# Patient Record
Sex: Female | Born: 1955 | Race: White | Hispanic: No | Marital: Married | Smoking: Never smoker
Health system: Southern US, Community
[De-identification: ages and names within clinical notes are randomized; demographics above are authoritative.]

## PROBLEM LIST (undated history)

## (undated) DIAGNOSIS — G43909 Migraine, unspecified, not intractable, without status migrainosus: Secondary | ICD-10-CM

## (undated) DIAGNOSIS — E785 Hyperlipidemia, unspecified: Secondary | ICD-10-CM

## (undated) DIAGNOSIS — M810 Age-related osteoporosis without current pathological fracture: Secondary | ICD-10-CM

## (undated) DIAGNOSIS — R011 Cardiac murmur, unspecified: Secondary | ICD-10-CM

## (undated) DIAGNOSIS — M1990 Unspecified osteoarthritis, unspecified site: Secondary | ICD-10-CM

## (undated) HISTORY — DX: Migraine, unspecified, not intractable, without status migrainosus: G43.909

## (undated) HISTORY — DX: Hyperlipidemia, unspecified: E78.5

## (undated) HISTORY — PX: COLONOSCOPY: SHX174

## (undated) HISTORY — DX: Age-related osteoporosis without current pathological fracture: M81.0

## (undated) HISTORY — DX: Cardiac murmur, unspecified: R01.1

---

## 1991-11-29 DIAGNOSIS — R011 Cardiac murmur, unspecified: Secondary | ICD-10-CM

## 1991-11-29 HISTORY — DX: Cardiac murmur, unspecified: R01.1

## 2001-09-05 ENCOUNTER — Encounter: Payer: Self-pay | Admitting: *Deleted

## 2001-09-05 ENCOUNTER — Ambulatory Visit (HOSPITAL_COMMUNITY): Admission: RE | Admit: 2001-09-05 | Discharge: 2001-09-05 | Payer: Self-pay | Admitting: *Deleted

## 2003-10-17 ENCOUNTER — Ambulatory Visit (HOSPITAL_COMMUNITY): Admission: RE | Admit: 2003-10-17 | Discharge: 2003-10-17 | Payer: Self-pay | Admitting: *Deleted

## 2005-03-21 ENCOUNTER — Ambulatory Visit (HOSPITAL_COMMUNITY): Admission: RE | Admit: 2005-03-21 | Discharge: 2005-03-21 | Payer: Self-pay | Admitting: *Deleted

## 2005-06-09 ENCOUNTER — Ambulatory Visit (HOSPITAL_COMMUNITY): Admission: RE | Admit: 2005-06-09 | Discharge: 2005-06-09 | Payer: Self-pay | Admitting: *Deleted

## 2006-04-21 ENCOUNTER — Ambulatory Visit (HOSPITAL_COMMUNITY): Admission: RE | Admit: 2006-04-21 | Discharge: 2006-04-21 | Payer: Self-pay | Admitting: Family Medicine

## 2006-11-28 HISTORY — PX: COLONOSCOPY: SHX174

## 2007-02-08 ENCOUNTER — Ambulatory Visit: Payer: Self-pay | Admitting: Internal Medicine

## 2007-03-12 ENCOUNTER — Ambulatory Visit (HOSPITAL_COMMUNITY): Admission: RE | Admit: 2007-03-12 | Discharge: 2007-03-12 | Payer: Self-pay | Admitting: Internal Medicine

## 2007-03-12 ENCOUNTER — Ambulatory Visit: Payer: Self-pay | Admitting: Internal Medicine

## 2007-07-13 ENCOUNTER — Ambulatory Visit (HOSPITAL_COMMUNITY): Admission: RE | Admit: 2007-07-13 | Discharge: 2007-07-13 | Payer: Self-pay | Admitting: Family Medicine

## 2008-01-02 ENCOUNTER — Encounter (HOSPITAL_COMMUNITY): Admission: RE | Admit: 2008-01-02 | Discharge: 2008-02-01 | Payer: Self-pay | Admitting: Neurosurgery

## 2008-08-05 ENCOUNTER — Ambulatory Visit (HOSPITAL_COMMUNITY): Admission: RE | Admit: 2008-08-05 | Discharge: 2008-08-05 | Payer: Self-pay | Admitting: Family Medicine

## 2009-01-08 ENCOUNTER — Ambulatory Visit (HOSPITAL_COMMUNITY): Admission: RE | Admit: 2009-01-08 | Discharge: 2009-01-08 | Payer: Self-pay | Admitting: Family Medicine

## 2009-06-17 ENCOUNTER — Emergency Department (HOSPITAL_COMMUNITY): Admission: EM | Admit: 2009-06-17 | Discharge: 2009-06-17 | Payer: Self-pay | Admitting: Emergency Medicine

## 2009-08-07 ENCOUNTER — Ambulatory Visit (HOSPITAL_COMMUNITY): Admission: RE | Admit: 2009-08-07 | Discharge: 2009-08-07 | Payer: Self-pay | Admitting: Family Medicine

## 2010-08-19 ENCOUNTER — Ambulatory Visit (HOSPITAL_COMMUNITY): Admission: RE | Admit: 2010-08-19 | Discharge: 2010-08-19 | Payer: Self-pay | Admitting: Family Medicine

## 2010-12-16 ENCOUNTER — Ambulatory Visit (HOSPITAL_COMMUNITY)
Admission: RE | Admit: 2010-12-16 | Discharge: 2010-12-16 | Payer: Self-pay | Source: Home / Self Care | Attending: Family Medicine | Admitting: Family Medicine

## 2011-04-15 NOTE — Op Note (Signed)
NAME:  Susan Liu, Susan Liu               ACCOUNT NO.:  [id number]   MEDICAL RECORD NO.:  [id number]          PATIENT TYPE:  AMB   LOCATION:  DAY                           FACILITY:  [facility]   PHYSICIAN:  R. Roetta Sessions, M.D. DATE OF BIRTH:  06/13/1956   DATE OF PROCEDURE:  03/12/2007  DATE OF DISCHARGE:                               OPERATIVE REPORT   PROCEDURE:  Screening colonoscopy.   INDICATIONS FOR PROCEDURE:  The patient a 55 year old lady with no GI  symptoms who is undergoing her first screening colonoscopy.  No prior  imaging of her colon.  No bowel symptoms.  She had some weight loss that  stabilized.  Colonoscopy is now being done.  This approach has discussed  the patient at length.  Potential risks, benefits, alternatives have  been reviewed, questions answered.  Please see documentation the medical  record.   PROCEDURE NOTE:  O2 saturation, blood pressure, pulse and monitored  throughout entire procedure.   CONSCIOUS SEDATION:  Versed 3 mg IV, Demerol 75 mg IV in divided doses.   INSTRUMENT:  Pentax video chip system.   FINDINGS:  Digital rectal exam revealed no abnormalities.  The prep was  excellent.  Examination colonic mucosa undertaken from the rectosigmoid  junction through the left, transverse, right colon to area appendiceal  orifice, ileocecal valve and cecum.  These structures well seen  photographed for the record.  From this level the scope was slowly and  cautiously withdrawn.  All previous mentioned mucosal surfaces were  again seen.  Colon mucosa appeared normal.  Scope was pulled down to the  rectum where a thorough examination of the rectal mucosa revealed only a  single anal papilla and minimal internal hemorrhoids, otherwise rectal  mucosa appeared normal.  The patient tolerated the procedure well was  reacted endoscopy.   IMPRESSION:  Single anal papilla and minimal internal hemorrhoids,  otherwise normal rectum.  Normal colon.   RECOMMENDATIONS:   Repeat screening colonoscopy 10 years.      Jonathon Bellows, M.D.  Electronically Signed     RMR/MEDQ  D:  03/12/2007  T:  03/12/2007  Job:  [id number]   cc:   Lorin Picket A. Gerda Diss, MD  Fax: [phone]

## 2011-04-15 NOTE — Consult Note (Signed)
NAMECORLEY, KOHLS               ACCOUNT NO.:  [id number]   MEDICAL RECORD NO.:  [id number]          PATIENT TYPE:  AMB   LOCATION:  DAY                           FACILITY:  [facility]   PHYSICIAN:  R. Roetta Sessions, M.D. DATE OF BIRTH:  Jun 04, 1956   DATE OF CONSULTATION:  DATE OF DISCHARGE:                                 CONSULTATION   REFERRING PHYSICIAN:  Scott A. Gerda Diss, MD   REASON FOR CONSULTATION:  Consideration of colonoscopy and weight loss.   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  Sent over through the courtesy of Dr. Lilyan Punt  to further evaluate recent weight loss and to set up a screening  colonoscopy.  Ms. Ricciardi tells me that she has been noted to insidiously  drop down from her usual 118-119 pounds weight to as low as 113 pounds  recently in Dr. Fletcher Anon office.  We weigh her at 115 pounds today.   Ms. Clippinger' GI review of systems is essentially entirely negative.  She  specifically denies odynophagia, dysphagia, early satiety, reflux  symptoms, nausea, vomiting, abdominal pain, melena, constipation, clay-  colored stools or dark-colored urine.  She does not have any heat or  cold intolerance.  She has had a battery of labs done recently, which  are in our chart for review.  CBC, BMET LFTs looked good.  Her lipid  profile indicated an elevated LDL of 166.  I do not see where she has  had thyroid studies done recently.  There is no family history of GI or  other neoplasia.  She has never had her lower GI tract evaluated.   PAST MEDICAL HISTORY:  Significant for osteoporosis, chronic low back  pain, hyperlipidemia.   PAST SURGERIES:  Two C-sections, hemorrhoid surgery years ago.   CURRENT MEDICATIONS:  Boniva and pravastatin.   ALLERGIES:  ERYTHROMYCIN.   FAMILY HISTORY:  Father died with COPD and an MI at age 56.  Mother is  alive with osteoporosis.  Otherwise, no history of chronic GI or liver   illness.   SOCIAL HISTORY:  She is married.  She has two children.  She has a 44-  year-old son and a 66 year old daughter in good health.  She is employed  with the [employer].  She does not use tobacco, alcohol or  illicit drugs.   REVIEW OF SYSTEMS:  She has not had any fever, chills, chest pain.  Some  weight loss as outlined above.  Otherwise as in history of present  illness.   PHYSICAL EXAMINATION:  GENERAL:  A pleasant 55 year old lady resting  comfortably.  VITAL SIGNS:  Weight 115, height is 5 feet.  Temperature 98.4, BP  100/68, pulse 88.  SKIN:  Warm and dry.  There is no jaundice or cutaneous stigmata of  chronic liver disease.  HEENT:  No scleral icterus  conjunctivae are pink.  Oral cavity:  No  lesions.  CHEST:  Lungs are clear to auscultation.  CARDIAC:  Regular rate and rhythm without murmur, gallop.  BREASTS:  Exam is deferred.  ABDOMEN:  Nondistended.  Positive bowel sounds, soft and nontender,  without appreciable mass or organomegaly.  EXTREMITIES:  No edema.  RECTAL:  Exam is deferred to the time of colonoscopy.   IMPRESSION:  Ms. Chestina Komatsu is a pleasant 55 year old lady  essentially devoid of any gastrointestinal symptoms, referred for  evaluation of weight loss.  Her weight loss appears to currently be less  than 5 pounds.  She feels well and the weight loss documented thus far  may not be, in fact, significant.  I do not see where she has had her  thyroid status checked recently.   She has crossed the threshold age for screening colonoscopy.   RECOMMENDATIONS:  I have offered Ms. Bleau a screening colonoscopy in  the very near future.  Potential risks, benefits, alternatives have been  reviewed, questions answered.  She is agreeable.  I have recommended  that she ought to have her thyroid status checked through Dr. Fletcher Anon  office if not already recently done.  Will make further recommendations  once a screening colonoscopy has been  performed.   I would like to thank Dr. Lilyan Punt for allowing me see this very  nice lady today      R. Roetta Sessions, M.D.  Electronically Signed     RMR/MEDQ  D:  02/08/2007  T:  02/09/2007  Job:  [id number]   cc:   Lorin Picket A. Gerda Diss, MD  Fax: [phone]

## 2012-01-05 ENCOUNTER — Other Ambulatory Visit: Payer: Self-pay | Admitting: Family Medicine

## 2012-01-05 DIAGNOSIS — Z139 Encounter for screening, unspecified: Secondary | ICD-10-CM

## 2012-01-12 ENCOUNTER — Other Ambulatory Visit (HOSPITAL_COMMUNITY): Payer: Self-pay

## 2012-01-12 ENCOUNTER — Ambulatory Visit (HOSPITAL_COMMUNITY): Payer: Self-pay

## 2012-01-19 ENCOUNTER — Ambulatory Visit (HOSPITAL_COMMUNITY)
Admission: RE | Admit: 2012-01-19 | Discharge: 2012-01-19 | Disposition: A | Discharge status: Home / Self Care | Payer: BC Managed Care – PPO | Source: Ambulatory Visit | Attending: Family Medicine | Admitting: Family Medicine

## 2012-01-19 DIAGNOSIS — Z139 Encounter for screening, unspecified: Secondary | ICD-10-CM

## 2012-01-19 DIAGNOSIS — Z780 Asymptomatic menopausal state: Secondary | ICD-10-CM | POA: Insufficient documentation

## 2012-01-19 DIAGNOSIS — Z13820 Encounter for screening for osteoporosis: Secondary | ICD-10-CM | POA: Insufficient documentation

## 2012-01-19 DIAGNOSIS — M899 Disorder of bone, unspecified: Secondary | ICD-10-CM | POA: Insufficient documentation

## 2012-01-19 DIAGNOSIS — Z1231 Encounter for screening mammogram for malignant neoplasm of breast: Secondary | ICD-10-CM | POA: Insufficient documentation

## 2013-03-06 ENCOUNTER — Other Ambulatory Visit: Payer: Self-pay | Admitting: Family Medicine

## 2013-03-06 DIAGNOSIS — Z139 Encounter for screening, unspecified: Secondary | ICD-10-CM

## 2013-03-07 ENCOUNTER — Ambulatory Visit (HOSPITAL_COMMUNITY)
Admission: RE | Admit: 2013-03-07 | Discharge: 2013-03-07 | Disposition: A | Discharge status: Home / Self Care | Payer: BC Managed Care – PPO | Source: Ambulatory Visit | Attending: Family Medicine | Admitting: Family Medicine

## 2013-03-07 DIAGNOSIS — Z1231 Encounter for screening mammogram for malignant neoplasm of breast: Secondary | ICD-10-CM | POA: Insufficient documentation

## 2013-03-07 DIAGNOSIS — Z139 Encounter for screening, unspecified: Secondary | ICD-10-CM

## 2013-05-02 ENCOUNTER — Telehealth: Payer: Self-pay | Admitting: Family Medicine

## 2013-05-02 NOTE — Telephone Encounter (Signed)
Left message on patient's voicemail.  We are unable to get prior auth for her Sumatriptan Succinate 100mg  tab.  Her insurance allows 27 tabs in a 90 day period.  Her last fill date was 02/18/13.  She may get refill on 05/18/2013 or come in for office visit so we can obtain needed documentation of migraines and frequency to get larger quantity if needed.  All of this information was left on her voicemail

## 2013-05-03 ENCOUNTER — Ambulatory Visit (INDEPENDENT_AMBULATORY_CARE_PROVIDER_SITE_OTHER): Payer: BC Managed Care – PPO | Admitting: Family Medicine

## 2013-05-03 ENCOUNTER — Encounter: Payer: Self-pay | Admitting: Family Medicine

## 2013-05-03 VITALS — BP 116/72 | Temp 98.3°F | Wt 125.2 lb

## 2013-05-03 DIAGNOSIS — G43909 Migraine, unspecified, not intractable, without status migrainosus: Secondary | ICD-10-CM

## 2013-05-03 NOTE — Patient Instructions (Signed)
Topamax  [email or web address]  Call me with your decision

## 2013-05-03 NOTE — Progress Notes (Signed)
  Subjective:    Patient ID: Susan Liu, female    DOB: 1956/06/20, 57 y.o.   MRN: [id number]  Migraine  This is a recurrent problem. The problem occurs intermittently. The pain radiates to the right neck. Nothing aggravates the symptoms. She has tried triptans for the symptoms.   Am Headaches. Describes it as back and ahead around the temples around the eyes and the face. Ibuprofen does not help to Zomig did not help. She states Imitrex does help in within 30 minutes the headaches is gone she uses approximately anywhere between 7 and 10 per month. She states she does not get other types of headaches they do not wake her up at night no double vision or vomiting. No fevers. Past medical history migraines family history noncontributory social doesn't smoke   Review of Systems See above    Objective:   Physical Exam Eardrums normal throat normal neck supple lungs clear heart regular neurologic grossly normal       Assessment & Plan:  #1 frequent atypical migraines-Imitrex helps greatly. We will go ahead and initiate the refills. He may take some time to get this approved for hopefully they will approve it without difficulty. The patient gets at least 8-9 attacks a month easily goes through 9 Imitrex a month. She will consider Topamax or frequent attacks as a preventative medicine she will  Think about it and call us back.  Immitrex works where as others did not, she uses more than 27 every three months. She does not want to go on preventative currently, she would benefit from 35 tablets every three months

## 2013-05-06 ENCOUNTER — Telehealth: Payer: Self-pay | Admitting: Family Medicine

## 2013-05-06 NOTE — Telephone Encounter (Signed)
Can not locate chart.  Patient was seen on Friday AM--May 03, 2013  Only has 3 pill left.    Please call patient she is stating she wants to go ahead and get this medication at [facility] on [address].  She is stressing out over this medication.  Was told on Friday AM at her visit that we would send right away to [facility] and they have not received anything from our office.  Would like to get this medicaiton in the future from [facility] due to lower cost.  States this will cost her about $20 more at pharmacy.  Please call patient to let her know if this will be sent to [facility] for future refills!  Please call patient as soon possible.  Okay to leave voicemail on cell phone.  Thanks

## 2013-05-06 NOTE — Telephone Encounter (Signed)
Patient advised Enid Derry is working on prior [place] for refill.

## 2013-05-14 ENCOUNTER — Telehealth: Payer: Self-pay | Admitting: Family Medicine

## 2013-05-14 NOTE — Telephone Encounter (Signed)
Patient called to say that she does not want to get more than 27 tablets in 90 days of her Sumatriptan.  She did state that she is going to stop getting them through her mail order pharmacy.  She is going to call us in a couple of months to request a refill.  Whether it be hand written or e-scribed, she will give [place] a local pharmacy that she wishes to use and go forward from there.  I did explain to her that we were unable to obtain quantity override and she understands and is ok with that.  She wishes NOT to be on a preventive medicine at this time.

## 2013-05-14 NOTE — Telephone Encounter (Signed)
Ok thanks 

## 2013-05-21 ENCOUNTER — Encounter: Payer: Self-pay | Admitting: *Deleted

## 2013-05-23 ENCOUNTER — Encounter: Payer: Self-pay | Admitting: Family Medicine

## 2013-05-23 ENCOUNTER — Ambulatory Visit (INDEPENDENT_AMBULATORY_CARE_PROVIDER_SITE_OTHER): Payer: BC Managed Care – PPO | Admitting: Nurse Practitioner

## 2013-05-23 ENCOUNTER — Encounter: Payer: Self-pay | Admitting: Nurse Practitioner

## 2013-05-23 VITALS — BP 104/62 | HR 70 | Ht 60.75 in | Wt 124.0 lb

## 2013-05-23 DIAGNOSIS — Z01419 Encounter for gynecological examination (general) (routine) without abnormal findings: Secondary | ICD-10-CM

## 2013-05-23 DIAGNOSIS — E785 Hyperlipidemia, unspecified: Secondary | ICD-10-CM

## 2013-05-23 DIAGNOSIS — J309 Allergic rhinitis, unspecified: Secondary | ICD-10-CM

## 2013-05-23 DIAGNOSIS — J300 Vasomotor rhinitis: Secondary | ICD-10-CM

## 2013-05-23 DIAGNOSIS — Z0000 Encounter for general adult medical examination without abnormal findings: Secondary | ICD-10-CM

## 2013-05-23 DIAGNOSIS — G43909 Migraine, unspecified, not intractable, without status migrainosus: Secondary | ICD-10-CM

## 2013-05-23 DIAGNOSIS — M810 Age-related osteoporosis without current pathological fracture: Secondary | ICD-10-CM

## 2013-05-23 MED ORDER — TOPIRAMATE 25 MG PO TABS: ORAL_TABLET | ORAL | Status: DC

## 2013-05-23 MED ORDER — FLUTICASONE PROPIONATE 50 MCG/ACT NA SUSP: NASAL | Status: DC

## 2013-05-23 MED ORDER — PRAVASTATIN SODIUM 80 MG PO TABS: 80.0000 mg | ORAL_TABLET | Freq: Every day | ORAL | Status: DC

## 2013-05-23 MED ORDER — SUMATRIPTAN SUCCINATE 100 MG PO TABS: ORAL_TABLET | ORAL | Status: DC

## 2013-05-23 MED ORDER — ALENDRONATE SODIUM 70 MG PO TABS: 70.0000 mg | ORAL_TABLET | ORAL | Status: DC

## 2013-05-23 NOTE — Patient Instructions (Addendum)
Icy Heat TENS unit Luvena 2-3 x per week for vaginal health

## 2013-05-24 ENCOUNTER — Encounter: Payer: Self-pay | Admitting: Nurse Practitioner

## 2013-05-24 DIAGNOSIS — M810 Age-related osteoporosis without current pathological fracture: Secondary | ICD-10-CM

## 2013-05-24 DIAGNOSIS — E785 Hyperlipidemia, unspecified: Secondary | ICD-10-CM | POA: Insufficient documentation

## 2013-05-24 HISTORY — DX: Age-related osteoporosis without current pathological fracture: M81.0

## 2013-05-24 NOTE — Assessment & Plan Note (Signed)
Continue Fosamax as directed. Continue calcium and vitamin D supplementation. Weightbearing exercises.

## 2013-05-24 NOTE — Progress Notes (Signed)
Subjective:    Patient ID: Susan Liu, female    DOB: 31-May-1956, 57 y.o.   MRN: [id number]  HPI presents for her wellness checkup. Married, same sexual partner. No pelvic pain. No discharge. Regular dental and vision care. Continues to have frequent migraines. Has discussed this with Dr. Lorin Picket. Would like to start Topamax as previously recommended. Getting regular activity at least one hour per day. Doing well with her diet. Has a history of chronic low back pain which limits some of her activities. Is scheduled for lab work at her job in October. No acid reflux or heartburn. Compliant with medications.    Review of Systems  Constitutional: Negative for fever, activity change, appetite change and fatigue.  HENT: Positive for congestion and postnasal drip. Negative for sore throat and dental problem.   Eyes: Negative for visual disturbance.  Respiratory: Negative for cough, chest tightness, shortness of breath and wheezing.   Cardiovascular: Negative for chest pain.  Gastrointestinal: Negative for nausea, vomiting, diarrhea, constipation, blood in stool and abdominal distention.  Genitourinary: Negative for dysuria, urgency, frequency, vaginal bleeding, vaginal discharge, difficulty urinating, menstrual problem and pelvic pain.  Musculoskeletal: Positive for back pain.  Neurological: Positive for headaches. Negative for weakness and numbness.  Psychiatric/Behavioral: Negative for sleep disturbance.       Objective:   Physical Exam  Vitals reviewed. Constitutional: She is oriented to person, place, and time. She appears well-developed. No distress.  HENT:  Right Ear: External ear normal.  Left Ear: External ear normal.  Mouth/Throat: Oropharynx is clear and moist.  Neck: Normal range of motion. Neck supple. No tracheal deviation present. No thyromegaly present.  Cardiovascular: Normal rate, regular rhythm and normal heart sounds.  Exam reveals no gallop.   No murmur  heard. Pulmonary/Chest: Effort normal and breath sounds normal. She has no wheezes.  Abdominal: Soft. She exhibits no distension and no mass. There is no tenderness.  Genitourinary: Vagina normal and uterus normal. No vaginal discharge found.  Musculoskeletal: She exhibits no edema.  Lymphadenopathy:    She has no cervical adenopathy.  Neurological: She is alert and oriented to person, place, and time.  Skin: Skin is warm and dry. No rash noted.  Psychiatric: She has a normal mood and affect. Her behavior is normal.   TMs mild clear effusion. Pharynx nonerythematous with cloudy PND noted. Breast exam: Dense tissue, no obvious masses. Axilla no adenopathy. GU: EGBUS normal limit, vagina pale, no CMT. Bimanual exam normal limit. Rectal exam normal limit, no stool for Hemoccult.        Assessment & Plan:  Well woman exam  Migraines  Vasomotor rhinitis  Hyperlipemia  Osteoporosis, unspecified    Meds ordered this encounter  Medications  . alendronate (FOSAMAX) 70 MG tablet    Sig: Take 1 tablet (70 mg total) by mouth every 7 (seven) days. Take with a full glass of water on an empty stomach.    Dispense:  12 tablet    Refill:  3    Order Specific Question:  Supervising Provider    Answer:  Merlyn Albert [[id number]]  . pravastatin (PRAVACHOL) 80 MG tablet    Sig: Take 1 tablet (80 mg total) by mouth daily.    Dispense:  90 tablet    Refill:  3    Order Specific Question:  Supervising Provider    Answer:  Merlyn Albert [[id number]]  . SUMAtriptan (IMITREX) 100 MG tablet    Sig: Take one at onset of migraine; may  repeat once in 2 hours if needed; max 2 per 24 hours    Dispense:  27 tablet    Refill:  3    Order Specific Question:  Supervising Provider    Answer:  Merlyn Albert [[id number]]  . fluticasone (FLONASE) 50 MCG/ACT nasal spray    Sig: 2 sprays each nostril once daily as needed    Dispense:  16 g    Refill:  11    Order Specific Question:  Supervising Provider     Answer:  Merlyn Albert [[id number]]  . topiramate (TOPAMAX) 25 MG tablet    Sig: One po q PM x 7 d then one po BID x 7 d then one po q am and 2 po q PM    Dispense:  42 tablet    Refill:  0    Order Specific Question:  Supervising Provider    Answer:  Merlyn Albert [[id number]]   Continue calcium and vitamin D supplementation. Continue current meds as directed. Start Topamax as directed. Discussed potential adverse effects. DC med and call if any problems. Otherwise recheck in 3 weeks, bring headache diary at that time.

## 2013-05-24 NOTE — Assessment & Plan Note (Signed)
-  Continue pravastatin as directed. 

## 2013-05-24 NOTE — Assessment & Plan Note (Signed)
Start Topamax as directed. Imitrex as directed when necessary. Keep headache diary and recheck in 3 weeks.

## 2013-06-13 ENCOUNTER — Encounter: Payer: Self-pay | Admitting: Family Medicine

## 2013-06-13 ENCOUNTER — Encounter: Payer: Self-pay | Admitting: Nurse Practitioner

## 2013-06-13 ENCOUNTER — Ambulatory Visit (INDEPENDENT_AMBULATORY_CARE_PROVIDER_SITE_OTHER): Payer: BC Managed Care – PPO | Admitting: Nurse Practitioner

## 2013-06-13 VITALS — BP 102/76 | HR 70 | Wt 123.4 lb

## 2013-06-13 DIAGNOSIS — Z733 Stress, not elsewhere classified: Secondary | ICD-10-CM

## 2013-06-13 DIAGNOSIS — G43909 Migraine, unspecified, not intractable, without status migrainosus: Secondary | ICD-10-CM

## 2013-06-13 DIAGNOSIS — F439 Reaction to severe stress, unspecified: Secondary | ICD-10-CM

## 2013-06-13 DIAGNOSIS — Z23 Encounter for immunization: Secondary | ICD-10-CM

## 2013-06-13 MED ORDER — TOPIRAMATE 50 MG PO TABS: 50.0000 mg | ORAL_TABLET | Freq: Two times a day (BID) | ORAL | Status: DC

## 2013-06-13 NOTE — Progress Notes (Signed)
Subjective:  Presents for recheck of her migraines. It one time was having 9 migraines per month. Headaches began to improve at the end of June, had a great week the first week of July. Began having a migraine on Monday which lasted through Wednesday. Patient attributes this to extreme stress at work. Is considering changing her job title and working less hours. Took 1 Imitrex at the end of June. Overall headaches seem to be improving with Topamax. Denies any significant adverse effects.  Objective:   BP 102/76  Pulse 70  Wt 123 lb 6.4 oz (55.974 kg)  BMI 23.51 kg/m2 NAD. Alert, oriented. Crying at times during office visit to talk about her job. Lungs clear. Heart regular rate rhythm.  Assessment:Migraines  Need for tetanus booster - Plan: Dt vaccine greater than 7yo IM  Situational stress  Plan: Meds ordered this encounter  Medications  . topiramate (TOPAMAX) 50 MG tablet    Sig: Take 1 tablet (50 mg total) by mouth 2 (two) times daily.    Dispense:  60 tablet    Refill:  0    Order Specific Question:  Supervising Provider    Answer:  Merlyn Albert [[id number]]   Patient to go back to previous dose if any problems. Discussed importance of stress reduction. Feel this is a major trigger for her migraines. Recheck in 4 months, call back sooner if any problems.

## 2013-06-13 NOTE — Assessment & Plan Note (Signed)
Plan: Meds ordered this encounter  Medications  . topiramate (TOPAMAX) 50 MG tablet    Sig: Take 1 tablet (50 mg total) by mouth 2 (two) times daily.    Dispense:  60 tablet    Refill:  0    Order Specific Question:  Supervising Provider    Answer:  Merlyn Albert [[id number]]   Patient to go back to previous dose if any problems. Discussed importance of stress reduction. Feel this is a major trigger for her migraines. Recheck in 4 months, call back sooner if any problems.

## 2013-06-20 ENCOUNTER — Telehealth: Payer: Self-pay | Admitting: Nurse Practitioner

## 2013-06-20 ENCOUNTER — Encounter: Payer: Self-pay | Admitting: Nurse Practitioner

## 2013-06-20 NOTE — Telephone Encounter (Signed)
Dicussed with pt

## 2013-06-20 NOTE — Telephone Encounter (Signed)
LMRC 06/20/13 

## 2013-06-20 NOTE — Telephone Encounter (Signed)
The medication is Topamax.

## 2013-06-20 NOTE — Telephone Encounter (Signed)
If she has already taken 2 50 mg pills today, then resume 50 mg BID as prescribed; if not take one 50 mg tab at bedtime tonight then resume

## 2013-06-20 NOTE — Telephone Encounter (Signed)
Pt thought she was to be  taking 25mg  QID according to her conversation with Eber Jones, however she was prescribed 50mg  BID and the patient just realized today that she has been taking it wrong. She wants to know if she should resume her dosage as prescribed now or should she skip for today? Pt says she feels ok, just a little woozy headed. Please advise.

## 2013-06-28 ENCOUNTER — Other Ambulatory Visit (INDEPENDENT_AMBULATORY_CARE_PROVIDER_SITE_OTHER): Payer: BC Managed Care – PPO | Admitting: *Deleted

## 2013-06-28 DIAGNOSIS — Z0000 Encounter for general adult medical examination without abnormal findings: Secondary | ICD-10-CM

## 2013-06-28 LAB — POC HEMOCCULT BLD/STL (HOME/3-CARD/SCREEN)
Card #2 Fecal Occult Blod, POC: NEGATIVE
Card #3 Fecal Occult Blood, POC: NEGATIVE
Fecal Occult Blood, POC: NEGATIVE

## 2013-07-04 ENCOUNTER — Encounter: Payer: Self-pay | Admitting: Nurse Practitioner

## 2013-07-09 ENCOUNTER — Other Ambulatory Visit: Payer: Self-pay

## 2013-07-09 MED ORDER — SUMATRIPTAN SUCCINATE 100 MG PO TABS: 100.0000 mg | ORAL_TABLET | Freq: Every day | ORAL | Status: DC | PRN

## 2013-07-11 ENCOUNTER — Encounter: Payer: Self-pay | Admitting: Nurse Practitioner

## 2013-08-08 ENCOUNTER — Encounter: Payer: Self-pay | Admitting: Family Medicine

## 2013-08-14 ENCOUNTER — Encounter: Payer: Self-pay | Admitting: Family Medicine

## 2013-08-15 ENCOUNTER — Encounter: Payer: Self-pay | Admitting: Nurse Practitioner

## 2013-08-29 ENCOUNTER — Telehealth: Payer: Self-pay | Admitting: Family Medicine

## 2013-08-29 ENCOUNTER — Other Ambulatory Visit: Payer: Self-pay | Admitting: Nurse Practitioner

## 2013-08-29 ENCOUNTER — Telehealth: Payer: Self-pay | Admitting: *Deleted

## 2013-08-29 DIAGNOSIS — E785 Hyperlipidemia, unspecified: Secondary | ICD-10-CM

## 2013-08-29 DIAGNOSIS — M810 Age-related osteoporosis without current pathological fracture: Secondary | ICD-10-CM

## 2013-08-29 DIAGNOSIS — Z79899 Other long term (current) drug therapy: Secondary | ICD-10-CM

## 2013-08-29 DIAGNOSIS — Z0000 Encounter for general adult medical examination without abnormal findings: Secondary | ICD-10-CM

## 2013-08-29 NOTE — Telephone Encounter (Signed)
Sorry I did not get first message sent to [place]. Please leave a copy of labwork needed upfront. Patient can have this drawn at work and send [place] a copy.

## 2013-08-29 NOTE — Telephone Encounter (Signed)
Left message telling her that we put her BW orders in.

## 2013-08-29 NOTE — Telephone Encounter (Addendum)
Patient states that she tried to convey in her messages that she does not want to have blood work completed at [facility] due to cost.  She is employee of the [place] and she can have her Blood Work drawn for [employer] at [employer].  Please call when she can stop by and pick up paper work to have this completed.

## 2013-08-30 NOTE — Telephone Encounter (Signed)
Patient was notified blood work was ordered and ready for pickup.

## 2013-09-05 ENCOUNTER — Encounter: Payer: Self-pay | Admitting: Nurse Practitioner

## 2013-10-03 ENCOUNTER — Other Ambulatory Visit: Payer: Self-pay

## 2013-12-10 ENCOUNTER — Telehealth: Payer: Self-pay | Admitting: Family Medicine

## 2013-12-10 MED ORDER — PRAVASTATIN SODIUM 80 MG PO TABS: 80.0000 mg | ORAL_TABLET | Freq: Every day | ORAL | Status: DC

## 2013-12-10 NOTE — Telephone Encounter (Signed)
Pt needs a 2 week supply sent to pharmacy due to not  Getting her 90 day supply for at least 10 days. Pt miscounted  And didn't order in time. So [facility] told her to call us for an Emergency supply..  pravastatin (PRAVACHOL) 80 MG tablet  [facility], [place]

## 2013-12-10 NOTE — Telephone Encounter (Signed)
Left message on voicemail notifying patient that medication was sent to pharmacy.  

## 2013-12-13 ENCOUNTER — Telehealth: Payer: Self-pay | Admitting: Family Medicine

## 2013-12-13 NOTE — Telephone Encounter (Signed)
Review labs.patient wanting results.

## 2013-12-14 NOTE — Telephone Encounter (Signed)
labwork stable, she may have a copy . OV recommended for any detailed discussion.

## 2013-12-16 NOTE — Telephone Encounter (Signed)
Results discussed with patient. Patient verbalized understanding.

## 2014-01-24 ENCOUNTER — Encounter: Payer: Self-pay | Admitting: Family Medicine

## 2014-01-25 ENCOUNTER — Telehealth: Payer: Self-pay | Admitting: Family Medicine

## 2014-01-25 NOTE — Telephone Encounter (Signed)
Patient left message on Friday she is filling out forms for insurance and had some question about her bone density test she had done in 2013.She needs help with.

## 2014-01-27 NOTE — Telephone Encounter (Signed)
Patient notified and verbalized understanding of the test results. No further questions. 

## 2014-02-12 ENCOUNTER — Encounter: Payer: Self-pay | Admitting: Nurse Practitioner

## 2014-02-12 ENCOUNTER — Encounter: Payer: Self-pay | Admitting: Family Medicine

## 2014-02-12 ENCOUNTER — Ambulatory Visit (INDEPENDENT_AMBULATORY_CARE_PROVIDER_SITE_OTHER): Payer: BC Managed Care – PPO | Admitting: Nurse Practitioner

## 2014-02-12 VITALS — BP 126/80 | Temp 98.3°F | Ht 60.75 in | Wt 118.0 lb

## 2014-02-12 DIAGNOSIS — J069 Acute upper respiratory infection, unspecified: Secondary | ICD-10-CM

## 2014-02-12 MED ORDER — METHOCARBAMOL 750 MG PO TABS: 750.0000 mg | ORAL_TABLET | Freq: Three times a day (TID) | ORAL | Status: DC

## 2014-02-12 MED ORDER — AZITHROMYCIN 250 MG PO TABS: ORAL_TABLET | ORAL | Status: DC

## 2014-02-17 ENCOUNTER — Encounter: Payer: Self-pay | Admitting: Nurse Practitioner

## 2014-02-17 NOTE — Progress Notes (Signed)
Subjective:  Presents for complaints of sore throat and postnasal drainage that began 5 days ago. No fever. Nonproductive cough. Ear pressure. No wheezing. No headache.  Objective:   BP 126/80  Temp(Src) 98.3 F (36.8 C)  Ht 5' 0.75" (1.543 m)  Wt 118 lb (53.524 kg)  BMI 22.48 kg/m2 NAD. Alert, oriented. TMs clear effusion, no erythema. Pharynx injected with PND noted. Neck supple with mild soft slightly tender anterior adenopathy. Lungs clear. Heart regular rate rhythm.  Assessment:Acute upper respiratory infections of unspecified site  Plan: Meds ordered this encounter  Medications  . azithromycin (ZITHROMAX Z-PAK) 250 MG tablet    Sig: Take 2 tablets (500 mg) on  Day 1,  followed by 1 tablet (250 mg) once daily on Days 2 through 5.    Dispense:  6 each    Refill:  0    Order Specific Question:  Supervising Provider    Answer:  Mikey Kirschner [[id number]]  . methocarbamol (ROBAXIN) 750 MG tablet    Sig: Take 1 tablet (750 mg total) by mouth 3 (three) times daily. Prn muscle spasms    Dispense:  30 tablet    Refill:  0    Order Specific Question:  Supervising Provider    Answer:  Mikey Kirschner [[id number]]   Patient also requesting a refill on a muscle relaxant. OTC meds as directed for congestion. Call back if symptoms worsen or persist.

## 2014-02-24 ENCOUNTER — Encounter: Payer: Self-pay | Admitting: Family Medicine

## 2014-02-24 ENCOUNTER — Telehealth: Payer: Self-pay | Admitting: *Deleted

## 2014-02-24 MED ORDER — CYCLOBENZAPRINE HCL 10 MG PO TABS: 10.0000 mg | ORAL_TABLET | Freq: Three times a day (TID) | ORAL | Status: DC | PRN

## 2014-02-24 NOTE — Telephone Encounter (Signed)
Flexeril 10 numb thirty one tid prn spasm

## 2014-02-24 NOTE — Telephone Encounter (Signed)
Med sent to [facility]. Pt notified on voicemail.

## 2014-02-24 NOTE — Telephone Encounter (Signed)
The muscle relaxer prescribed my last visit is not working. I had a neck issue all day yesterday and took the maximum 3 pills over the course of the day. I was in pain all day. Is there anything stronger I can use, please? Thank you Last seen  02/12/14 prescribed robaxin

## 2014-05-03 ENCOUNTER — Other Ambulatory Visit: Payer: Self-pay | Admitting: Nurse Practitioner

## 2014-05-06 ENCOUNTER — Encounter: Payer: Self-pay | Admitting: Family Medicine

## 2014-05-06 ENCOUNTER — Other Ambulatory Visit: Payer: Self-pay | Admitting: Family Medicine

## 2014-05-06 DIAGNOSIS — M5090 Cervical disc disorder, unspecified, unspecified cervical region: Secondary | ICD-10-CM

## 2014-05-23 ENCOUNTER — Ambulatory Visit (INDEPENDENT_AMBULATORY_CARE_PROVIDER_SITE_OTHER): Payer: BC Managed Care – PPO | Admitting: Nurse Practitioner

## 2014-05-23 ENCOUNTER — Encounter: Payer: Self-pay | Admitting: Nurse Practitioner

## 2014-05-23 ENCOUNTER — Other Ambulatory Visit: Payer: Self-pay | Admitting: Nurse Practitioner

## 2014-05-23 VITALS — BP 130/78 | Temp 98.5°F | Ht 60.75 in | Wt 119.0 lb

## 2014-05-23 DIAGNOSIS — J069 Acute upper respiratory infection, unspecified: Secondary | ICD-10-CM

## 2014-05-23 MED ORDER — AZITHROMYCIN 250 MG PO TABS: ORAL_TABLET | ORAL | Status: DC

## 2014-05-27 ENCOUNTER — Encounter: Payer: Self-pay | Admitting: Nurse Practitioner

## 2014-05-27 NOTE — Progress Notes (Signed)
Subjective:  Presents complaints of sinus symptoms that began 2 days ago. No fever. Headache has resolved. Rare nonproductive cough. No wheezing. Sore throat is improved. Some ear pressure. Head congestion mainly on the left side which is common for her. No relief with OTC meds.   Objective:   BP 130/78  Temp(Src) 98.5 F (36.9 C) (Oral)  Ht 5' 0.75" (1.543 m)  Wt 119 lb (53.978 kg)  BMI 22.67 kg/m2 NAD. Alert, oriented. TMs mild clear effusion, no erythema. Pharynx clear. Neck supple with mild soft anterior adenopathy. Lungs clear. Heart regular rate rhythm.  Assessment:Acute upper respiratory infection  most likely viral illness  Plan: Meds ordered this encounter  Medications  . azithromycin (ZITHROMAX Z-PAK) 250 MG tablet    Sig: Take 2 tablets (500 mg) on  Day 1,  followed by 1 tablet (250 mg) once daily on Days 2 through 5.    Dispense:  6 each    Refill:  0    Order Specific Question:  Supervising Provider    Answer:  Mikey Kirschner [[id number]]   given written prescription for a Z-Pak to take over the weekend if needed. Continue OTC meds. Call back if worsens or persists.

## 2014-05-29 ENCOUNTER — Encounter: Payer: Self-pay | Admitting: Nurse Practitioner

## 2014-05-29 ENCOUNTER — Ambulatory Visit (INDEPENDENT_AMBULATORY_CARE_PROVIDER_SITE_OTHER): Payer: BC Managed Care – PPO | Admitting: Nurse Practitioner

## 2014-05-29 ENCOUNTER — Telehealth: Payer: Self-pay | Admitting: Family Medicine

## 2014-05-29 VITALS — BP 118/74 | HR 70 | Ht 60.5 in | Wt 120.0 lb

## 2014-05-29 DIAGNOSIS — Z0189 Encounter for other specified special examinations: Secondary | ICD-10-CM

## 2014-05-29 DIAGNOSIS — Z0000 Encounter for general adult medical examination without abnormal findings: Secondary | ICD-10-CM

## 2014-05-29 DIAGNOSIS — Z01419 Encounter for gynecological examination (general) (routine) without abnormal findings: Secondary | ICD-10-CM

## 2014-05-29 NOTE — Telephone Encounter (Signed)
FYI - Pt seen by Hoyle Sauer 05/29/14, states that she spoke with Dr. Rex Kras office and they recommend a new MRI before referral back to them so he has something to look at to decided when's best to bring her in Pt is scheduled to see Dr. Nicki Reaper 06/05/14 for recheck of her neck pain, will need OV info to use for precert process of MRI

## 2014-05-30 NOTE — Telephone Encounter (Signed)
I rec pt keep appt for the 9th to specifically discuss her neck, document exam and set up MRI and get proper info to help make sure the MRI isn't denied

## 2014-06-01 ENCOUNTER — Encounter: Payer: Self-pay | Admitting: Nurse Practitioner

## 2014-06-01 NOTE — Progress Notes (Signed)
   Subjective:    Patient ID: Susan Liu, female    DOB: 05-10-1956, 59 y.o.   MRN: [id number]  HPI presents for wellness physical. Regular vision and dental exams. Married, same sexual partner. Takes daily vitamin D and calcium supplementation. Healthy diet. Regular exercise. Plans to see her neurosurgeon Dr. Carloyn Manner for flareup of her neck pain. No vaginal bleeding.    Review of Systems  Constitutional: Negative for fever, activity change, appetite change and fatigue.  HENT: Negative for dental problem, ear pain, sinus pressure and sore throat.   Respiratory: Negative for cough, chest tightness, shortness of breath and wheezing.   Cardiovascular: Negative for chest pain and leg swelling.  Gastrointestinal: Negative for nausea, vomiting, abdominal pain, diarrhea, constipation, blood in stool and abdominal distention.  Genitourinary: Negative for dysuria, urgency, frequency, vaginal bleeding, vaginal discharge, enuresis, difficulty urinating, genital sores and pelvic pain.  Musculoskeletal: Positive for neck pain.       Objective:   Physical Exam  Vitals reviewed. Constitutional: She is oriented to person, place, and time. She appears well-developed. No distress.  HENT:  Right Ear: External ear normal.  Left Ear: External ear normal.  Mouth/Throat: Oropharynx is clear and moist.  Neck: Normal range of motion. Neck supple. No tracheal deviation present. No thyromegaly present.  Cardiovascular: Normal rate, regular rhythm and normal heart sounds.  Exam reveals no gallop.   No murmur heard. Pulmonary/Chest: Effort normal and breath sounds normal.  Abdominal: Soft. She exhibits no distension. There is no tenderness.  Genitourinary: Vagina normal and uterus normal. No vaginal discharge found.  External GU: no rashes or lesions. Vagina: no discharge. Bimanual exam: no tenderness or masses; ovaries nonpalpable. Rectal exam: no masses; no stool for hemoccult.  Musculoskeletal: She exhibits  no edema.  Lymphadenopathy:    She has no cervical adenopathy.  Neurological: She is alert and oriented to person, place, and time.  Skin: Skin is warm and dry. No rash noted.  Psychiatric: She has a normal mood and affect. Her behavior is normal.  Breast exam: no masses; axillae no adenopathy.        Assessment & Plan:  Well woman exam  Other specified examination - Plan: POC Hemoccult Bld/Stl (3-Cd Home Screen)  Recommend continued activity and healthy diet. Also daily vitamin D and calcium supplementation. Next physical in one year.

## 2014-06-03 NOTE — Telephone Encounter (Signed)
LMOVM to notify pt that we will hold referral until after pt sees Dr. Nicki Reaper on the 9th and gets a new MRI

## 2014-06-05 ENCOUNTER — Ambulatory Visit (INDEPENDENT_AMBULATORY_CARE_PROVIDER_SITE_OTHER): Payer: BC Managed Care – PPO | Admitting: Family Medicine

## 2014-06-05 ENCOUNTER — Encounter: Payer: BC Managed Care – PPO | Admitting: Family Medicine

## 2014-06-05 ENCOUNTER — Encounter: Payer: Self-pay | Admitting: Family Medicine

## 2014-06-05 VITALS — BP 122/72 | Temp 98.2°F | Ht 60.5 in | Wt 120.5 lb

## 2014-06-05 DIAGNOSIS — M949 Disorder of cartilage, unspecified: Secondary | ICD-10-CM

## 2014-06-05 DIAGNOSIS — M8580 Other specified disorders of bone density and structure, unspecified site: Secondary | ICD-10-CM | POA: Insufficient documentation

## 2014-06-05 DIAGNOSIS — M5412 Radiculopathy, cervical region: Secondary | ICD-10-CM

## 2014-06-05 DIAGNOSIS — G542 Cervical root disorders, not elsewhere classified: Secondary | ICD-10-CM

## 2014-06-05 DIAGNOSIS — M899 Disorder of bone, unspecified: Secondary | ICD-10-CM

## 2014-06-05 HISTORY — DX: Other specified disorders of bone density and structure, unspecified site: M85.80

## 2014-06-05 MED ORDER — SUMATRIPTAN SUCCINATE 100 MG PO TABS
100.0000 mg | ORAL_TABLET | Freq: Every day | ORAL | Status: DC | PRN
Start: 2014-06-05 — End: 2014-07-07

## 2014-06-05 NOTE — Progress Notes (Signed)
   Subjective:    Patient ID: Susan Liu, female    DOB: February 20, 1956, 58 y.o.   MRN: [id number]  Neck Pain  This is a new problem. The current episode started more than 1 year ago. The problem occurs intermittently. The problem has been gradually worsening. The pain is associated with nothing. The pain is moderate. Nothing aggravates the symptoms. The pain is same all the time. Stiffness is present all day. She has tried NSAIDs and chiropractic manipulation for the symptoms. The treatment provided mild relief.  right arm pain and pull throughout the day worse in the am Level of pain 8/10 Causes headache into the posterior neck and head, also very tender  Patient needs a refill on her Imitrex.    Patient wants to discuss the joint pain she has been experiencing in the mornings. ( Dr Carlis Abbott)    She still has headaches also.    Review of Systems  Musculoskeletal: Positive for neck pain.       Objective:   Physical Exam  Her HEENT exam is normal bilateral she does have tight hamstrings reflexes in arms and legs are good. She does have subjective discomfort into the right trapezius right rhomboid area and down into the upper triceps. Range of motion of the rotator cuff was normal. Lungs are clear hearts regular.      Assessment & Plan:  She has had persistent neck pain and discomfort she's tried chiropractic care she's also tried anti-inflammatories in the past 5-6 months without success she states that the pain is a level V/X daily in multiple days a week the level is 8/10 it causes frequent headaches in the back of her head along with causing severe pain that radiates from the right trapezius area into the upper triceps and her back. She does have a history of osteopenia and also has a history of degenerative disc disease I do believe this patient would benefit from having MRI.  Based on the findings of this may refer patient to neurosurgery but is also possible may need referral to  neurology. Patient might benefit from Botox injections for her headaches.

## 2014-06-09 ENCOUNTER — Other Ambulatory Visit: Payer: Self-pay | Admitting: Family Medicine

## 2014-06-09 DIAGNOSIS — Z139 Encounter for screening, unspecified: Secondary | ICD-10-CM

## 2014-06-12 ENCOUNTER — Ambulatory Visit (HOSPITAL_COMMUNITY)
Admission: RE | Admit: 2014-06-12 | Discharge: 2014-06-12 | Disposition: A | Discharge status: Home / Self Care | Payer: BC Managed Care – PPO | Source: Ambulatory Visit | Attending: Family Medicine | Admitting: Family Medicine

## 2014-06-12 ENCOUNTER — Encounter (HOSPITAL_COMMUNITY): Payer: Self-pay

## 2014-06-12 DIAGNOSIS — M949 Disorder of cartilage, unspecified: Principal | ICD-10-CM

## 2014-06-12 DIAGNOSIS — M542 Cervicalgia: Secondary | ICD-10-CM | POA: Insufficient documentation

## 2014-06-12 DIAGNOSIS — G96198 Other disorders of meninges, not elsewhere classified: Secondary | ICD-10-CM | POA: Insufficient documentation

## 2014-06-12 DIAGNOSIS — R51 Headache: Secondary | ICD-10-CM | POA: Insufficient documentation

## 2014-06-12 DIAGNOSIS — G9619 Other disorders of meninges, not elsewhere classified: Secondary | ICD-10-CM

## 2014-06-12 DIAGNOSIS — Q062 Diastematomyelia: Secondary | ICD-10-CM | POA: Insufficient documentation

## 2014-06-12 DIAGNOSIS — M899 Disorder of bone, unspecified: Secondary | ICD-10-CM | POA: Insufficient documentation

## 2014-06-13 NOTE — Addendum Note (Signed)
Addended by: Carmelina Noun on: 06/13/2014 08:27 AM   Modules accepted: Orders

## 2014-06-17 ENCOUNTER — Telehealth: Payer: Self-pay | Admitting: Family Medicine

## 2014-06-17 NOTE — Telephone Encounter (Signed)
Patient would like a copy of her bone density test results and she also wanted to know if we could make note on the paperwork about the areas that are bad and good. Also, wants to check on referral to Neurosurgery.

## 2014-06-17 NOTE — Telephone Encounter (Signed)
[name], please make a copy of her bone density paperwork. Please place him at best I can write comments on it. Brendale, please try to give the patient date regarding referral. I believe it is already in the system.

## 2014-06-18 ENCOUNTER — Ambulatory Visit (HOSPITAL_COMMUNITY)
Admission: RE | Admit: 2014-06-18 | Discharge: 2014-06-18 | Disposition: A | Discharge status: Home / Self Care | Payer: BC Managed Care – PPO | Source: Ambulatory Visit | Attending: Family Medicine | Admitting: Family Medicine

## 2014-06-18 DIAGNOSIS — Z1231 Encounter for screening mammogram for malignant neoplasm of breast: Secondary | ICD-10-CM | POA: Insufficient documentation

## 2014-06-18 DIAGNOSIS — Z139 Encounter for screening, unspecified: Secondary | ICD-10-CM

## 2014-06-18 NOTE — Telephone Encounter (Signed)
Spoke with patient yesterday, explained that I have sent referral & MRI results to Dr. Rex Kras office, he will review and they will call her to set up appt, pt verbalized understanding

## 2014-06-19 NOTE — Telephone Encounter (Signed)
Susan Liu, I cannot get the computer program to allow me to print a copy of her bone density report. Please call radiology. Please see if they would fax a copy of her bone density report. The patient is requesting that we forward this to her.

## 2014-06-25 ENCOUNTER — Telehealth: Payer: Self-pay | Admitting: Family Medicine

## 2014-06-25 NOTE — Telephone Encounter (Signed)
My Chart message sent

## 2014-06-25 NOTE — Telephone Encounter (Signed)
Patient requested her bone density results the other day and she wanted notes wrote on there that pointed out which areas were worse/better. She said there were no notes wrote on there telling her this, so she has asked that we do another copy of the bone density results with notes telling her which areas were worse/better and she will pick up or she said we can send her a message on myChart letting her know. Please advise.

## 2014-07-01 ENCOUNTER — Telehealth: Payer: Self-pay | Admitting: Family Medicine

## 2014-07-01 DIAGNOSIS — M549 Dorsalgia, unspecified: Secondary | ICD-10-CM

## 2014-07-01 NOTE — Telephone Encounter (Signed)
1) Can pt have a script for the shingles shot please Call when ready   2) Also would like to know what is going on with her referral to Dr Carloyn Manner? In great deal of discomfort and would like to get this going please.

## 2014-07-01 NOTE — Telephone Encounter (Signed)
Patient needs to check with insurance to verify they will cover a shingles shot at her age. A lot of insurances will not cover till age 58.  Patient has an appt. With Dr. Ander Slade November 3,15 at 10:15am.

## 2014-07-01 NOTE — Telephone Encounter (Signed)
Left message to return call 

## 2014-07-01 NOTE — Telephone Encounter (Signed)
#  1 give patient prescription for shingles shot #2 on July 17 we sent the information to Dr. Carloyn Manner. His office tends to be. Busy and is often slow with the referrals. We will call back to try to ask them to expedite their decision regarding when they will see the patient. Unfortunately it is not as simple as [place] calling up in scheduling an appointment. Instead there office requires we sent all of the information then they will review the information then they S sign the patient when they can be seen. I sympathize with the patient. Brendale please call in tried to ask them to urgently work on setting up her appointment

## 2014-07-02 NOTE — Telephone Encounter (Signed)
Patient states she can not wait till November and wants to be sent to a different neurosurgeon ASAP

## 2014-07-02 NOTE — Telephone Encounter (Signed)
Patient called this morning to say that she's decided to stick with her 09/30/14 appt with Dr. Carloyn Manner, called his office to inquire on cancellation list, they do not have one - explained to patient that she may call his office 9:00-9:30 daily and if she's the first one & there's a cancellation available she may can be seen sooner, pt verbalized understanding

## 2014-07-07 ENCOUNTER — Other Ambulatory Visit: Payer: Self-pay | Admitting: Family Medicine

## 2014-07-10 ENCOUNTER — Encounter: Payer: Self-pay | Admitting: Family Medicine

## 2014-07-10 ENCOUNTER — Other Ambulatory Visit: Payer: Self-pay | Admitting: Family Medicine

## 2014-07-10 DIAGNOSIS — G542 Cervical root disorders, not elsewhere classified: Secondary | ICD-10-CM

## 2014-07-24 ENCOUNTER — Other Ambulatory Visit: Payer: Self-pay | Admitting: Nurse Practitioner

## 2014-07-29 ENCOUNTER — Ambulatory Visit (HOSPITAL_COMMUNITY)
Admission: RE | Admit: 2014-07-29 | Discharge: 2014-07-29 | Disposition: A | Discharge status: Home / Self Care | Payer: BC Managed Care – PPO | Source: Ambulatory Visit | Attending: Neurosurgery | Admitting: Neurosurgery

## 2014-07-29 DIAGNOSIS — M542 Cervicalgia: Secondary | ICD-10-CM | POA: Insufficient documentation

## 2014-07-29 DIAGNOSIS — M2569 Stiffness of other specified joint, not elsewhere classified: Secondary | ICD-10-CM | POA: Insufficient documentation

## 2014-07-29 DIAGNOSIS — IMO0001 Reserved for inherently not codable concepts without codable children: Secondary | ICD-10-CM | POA: Insufficient documentation

## 2014-07-29 DIAGNOSIS — M62838 Other muscle spasm: Secondary | ICD-10-CM | POA: Insufficient documentation

## 2014-07-29 DIAGNOSIS — M2560 Stiffness of unspecified joint, not elsewhere classified: Secondary | ICD-10-CM | POA: Insufficient documentation

## 2014-07-29 NOTE — Evaluation (Signed)
Physical Therapy Evaluation  Patient Details  Name: Susan Liu MRN: [id number] Date of Birth: 1956-09-17  Today's Date: 07/29/2014 Time: 1310-1345 Susan Liu Time Calculation (min): 35 min Charge:  Evaluation              Visit#: 1 of 12  Re-eval: 08/28/14 Assessment Diagnosis: cervical pain  Next MD Visit: none  Authorization: BCBS    Past Medical History:  Past Medical History  Diagnosis Date  . Heart murmur 1993  . Osteoporosis   . Migraine    Past Surgical History:  Past Surgical History  Procedure Laterality Date  . Colonoscopy    . Cesarean section      x2    Subjective Symptoms/Limitations Symptoms: Susan Liu states that she has been having neck pain for years ,(16 years ago).  Her pain resolved and had been doing well for the past two years with progressive pain on her right side.  She is seeing a Restaurant manager, fast food and has been for a month now.  She goes to a massage therapist once every three months.  She has been referred to Susan Liu to try and  decrease her pain.   How long can you sit comfortably?: no change How long can you stand comfortably?: no change How long can you walk comfortably?: no change  Pain Assessment Currently in Pain?: Yes Pain Score:  (will go as high as a 9-10) Pain Location: Neck Pain Orientation: Right Pain Type: Chronic pain Pain Onset: More than a month ago Pain Frequency: Constant Pain Relieving Factors: moving  Effect of Pain on Daily Activities: sweeping and looking to the right side increases pain   Balance Screening Balance Screen Has the patient fallen in the past 6 months: No     Assessment Cervical AROM Cervical Flexion: wnl reps improve  Cervical Extension: wnl reps  Cervical - Right Side Bend: wnl reps   Cervical - Left Side Bend: wnl reps  Cervical - Right Rotation: decreased 25%  Cervical - Left Rotation: wnl  Cervical Strength Cervical Extension: 5/5 Cervical - Right Side Bend: 4/5 Cervical - Left Side Bend:  5/5 Palpation Palpation: Susan Liu is tight in almost all cervical mm as well as thoracic with mod spams throughout   Exercise/Treatments Mobility/Balance  Posture/Postural Control Posture/Postural Control: Postural limitations Postural Limitations: decreased kyphosis, increased cervcial spine     Seated Exercises Neck Retraction: 5 reps Shoulder Shrugs: 5 reps   Prone Exercises Other Prone Exercise: quadriped mad cat        Physical Therapy Assessment and Plan Susan Liu Assessment and Plan Clinical Impression Statement: Susan Liu is a 58 yo female who has had chronic cervical pain.  She is being referred to Susan Liu to decrase her sx.  Examination demonstrates forward head, decreased kyphosis, decreased rotation to the R as well as tightness and spsm in cervical and thoracic mm.  Susan Liu will benefit from skilled therapy to decrase Susan Liu pain and improve her quality of life.   Susan Liu will benefit from skilled therapeutic intervention in order to improve on the following deficits: Pain;Increased fascial restricitons;Impaired flexibility;Increased muscle spasms Rehab Potential: Good Susan Liu Frequency: Min 3X/week Susan Liu Duration: 4 weeks Susan Liu Treatment/Interventions: Therapeutic exercise;Manual techniques;Patient/family education Susan Liu Plan: Susan Liu visits will stress cervical stretches, scapular stabilization exercises and manual techniques to decrease spasm and tightness of cervical and thoracic mm.     Goals Home Exercise Program Susan Liu/caregiver will Perform Home Exercise Program: For increased ROM Susan Liu Goal: Perform Home Exercise Program - Progress: Goal set today Susan Liu Short  Term Goals Time to Complete Short Term Goals: 2 weeks Susan Liu Short Term Goal 1: Susan Liu to have full cervical rotation to the right Susan Liu Short Term Goal 2: Susan Liu to be able to hold head turned to the right for five minutes to engage in conversation at a dinner table. Susan Liu Short Term Goal 3: Pain level to be no greater than a 6/10 80% of the day Susan Liu Long Term Goals Time to  Complete Long Term Goals: 4 weeks Susan Liu Long Term Goal 1: I in advance HEP Susan Liu Long Term Goal 2: Susan Liu to be able to sweep without increased neck pain  Long Term Goal 3: Susan Liu pain to be no greater than a 3/10 70% of the day. Long Term Goal 4: Susan Liu to have no mm spasms   Problem List Patient Active Problem List   Diagnosis Date Noted  . Spasm of muscle 07/29/2014  . Stiffness of joints, not elsewhere classified, multiple sites 07/29/2014  . Osteopenia 06/05/2014  . Cervical nerve root compression 06/05/2014  . Osteoporosis, unspecified 05/24/2013  . Hyperlipemia 05/24/2013  . Migraines 05/03/2013    Susan Liu Plan of Care Susan Liu Home Exercise Plan: given   GP    RUSSELL,CINDY 07/29/2014, 5:18 PM  Physician Documentation Your signature is required to indicate approval of the treatment plan as stated above.  Please sign and either send electronically or make a copy of this report for your files and return this physician signed original.   Please mark one 1.__approve of plan  2. ___approve of plan with the following conditions.   ______________________________                                                          _____________________ Physician Signature                                                                                                             Date

## 2014-08-06 ENCOUNTER — Ambulatory Visit (HOSPITAL_COMMUNITY)
Admission: RE | Admit: 2014-08-06 | Discharge: 2014-08-06 | Disposition: A | Discharge status: Home / Self Care | Payer: BC Managed Care – PPO | Source: Ambulatory Visit | Attending: Neurosurgery | Admitting: Neurosurgery

## 2014-08-06 DIAGNOSIS — M62838 Other muscle spasm: Secondary | ICD-10-CM

## 2014-08-06 DIAGNOSIS — IMO0001 Reserved for inherently not codable concepts without codable children: Secondary | ICD-10-CM | POA: Diagnosis not present

## 2014-08-06 DIAGNOSIS — M2560 Stiffness of unspecified joint, not elsewhere classified: Secondary | ICD-10-CM

## 2014-08-06 NOTE — Progress Notes (Addendum)
Physical Therapy Treatment Patient Details  Name: Susan Liu MRN: [id number] Date of Birth: 05-22-1956  Today's Date: 08/06/2014 Time: 7616-0737 PT Time Calculation (min): 50 min Charge: TE 1062-6948, FOTO no charge, Manual [phone]  Visit#: 2 of 12  Re-eval: 08/28/14 Assessment Diagnosis: cervical pain  Next MD Visit: Kathyrn Sheriff unscheduled  Authorization: BCBS  Authorization Time Period:    Authorization Visit#:   of     Subjective: Symptoms/Limitations Symptoms: Pt reports compliance with HEP daily, current pain scale 3/10 dull pain Pain Assessment Currently in Pain?: Yes Pain Score: 3  Pain Location: Neck Pain Orientation: Right  Objective:  FOTO 54% limitation  Exercise/Treatments Stretches Upper Trapezius Stretch: 2 reps;30 seconds Neck Stretch: 2 reps;30 seconds Theraband Exercises Scapula Retraction: 10 reps;Green Shoulder Extension: 10 reps;Green Rows: 10 reps;Green Standing Exercises Other Standing Exercises: Postural strengthening green tband: scapular retraction, row, and extension 10x, pt given HEP Seated Exercises Neck Retraction: 10 reps;5 secs Shoulder Shrugs: 5 reps Other Seated Exercise: 3D neck excursion Other Seated Exercise: Scapular retraction 10x,  Manual Therapy Manual Therapy: Massage Massage: Supine with LE elevated STM to upper traps, levator scapula, supraspinalis and posterior cervical musculature.  Progress to thoracic trapezius massage next session.  Physical Therapy Assessment and Plan PT Assessment and Plan Clinical Impression Statement: Reviewed HEP for proper form and technique with minimal cueing required for form with cervical retraction.  Began neck stretches for upper trapezius and levator scapula, pt able to demonstrate appropriate form and technique with new stretches and given worksheet to add to HEP.  Progressed to scapular stability with theraband resistance with ability to demonstrate appropraite form and technique  following min cueing.  Manual techniques complete at end of session to reduce Bil upper trap, levator scapular and supraspinalis spams, able to reduce but unable to fully resolve.  Pt reported pain reduced and imporved cervical rotation at end of session.  Pt encouraged to drink water to reduce risk of headaches following manual.  FOTO complete with 54% limitation.   PT Plan: Pt visits will stress cervical stretches, scapular stabilization exercises and manual techniques to decrease spasm and tightness of cervical and thoracic mm.     Goals PT Short Term Goals PT Short Term Goal 1: Pt to have full cervical rotation to the right PT Short Term Goal 2: Pt to be able to hold head turned to the right for five minutes to engage in conversation at a dinner table. PT Short Term Goal 3: Pain level to be no greater than a 6/10 80% of the day PT Long Term Goals PT Long Term Goal 1: I in advance HEP PT Long Term Goal 2: Pt to be able to sweep without increased neck pain  Long Term Goal 3: Pt pain to be no greater than a 3/10 70% of the day. Long Term Goal 4: Pt to have no mm spasms   Problem List Patient Active Problem List   Diagnosis Date Noted  . Spasm of muscle 07/29/2014  . Stiffness of joints, not elsewhere classified, multiple sites 07/29/2014  . Osteopenia 06/05/2014  . Cervical nerve root compression 06/05/2014  . Osteoporosis, unspecified 05/24/2013  . Hyperlipemia 05/24/2013  . Migraines 05/03/2013    PT - End of Session Activity Tolerance: Patient tolerated treatment well General Behavior During Therapy: [facility] for tasks assessed/performed  GP    Aldona Lento 08/06/2014, 4:22 PM

## 2014-08-07 ENCOUNTER — Ambulatory Visit (HOSPITAL_COMMUNITY): Payer: BC Managed Care – PPO | Admitting: Physical Therapy

## 2014-08-07 ENCOUNTER — Ambulatory Visit (HOSPITAL_COMMUNITY)
Admission: RE | Admit: 2014-08-07 | Discharge: 2014-08-07 | Disposition: A | Discharge status: Home / Self Care | Payer: BC Managed Care – PPO | Source: Ambulatory Visit | Attending: Neurosurgery | Admitting: Neurosurgery

## 2014-08-07 DIAGNOSIS — IMO0001 Reserved for inherently not codable concepts without codable children: Secondary | ICD-10-CM | POA: Diagnosis not present

## 2014-08-07 DIAGNOSIS — M62838 Other muscle spasm: Secondary | ICD-10-CM

## 2014-08-07 DIAGNOSIS — M2560 Stiffness of unspecified joint, not elsewhere classified: Secondary | ICD-10-CM

## 2014-08-07 NOTE — Progress Notes (Signed)
Physical Therapy Treatment Patient Details  Name: Susan Liu MRN: [id number] Date of Birth: [birth year]  Today's Date: 08/07/2014 Time: 1345-1430 PT Time Calculation (min): 45 min Visit#: 3 of 12  Charges:  therex 1031-5945 (33'), massage 1418-1430 (12')   Subjective: Symptoms/Limitations Symptoms: Pt reported pain varies throughout day, pain minimal in morning and increases throughout day.  "It comes and goes" Pain Assessment Currently in Pain?: Yes Pain Score: 3  Pain Location: Neck Pain Orientation: Right   Exercise/Treatments Therex instructed by Ihor Austin, PTA Stretches Upper Trapezius Stretch: 3 reps;30 seconds Neck Stretch: 3 reps;30 seconds Theraband Exercises Scapula Retraction: 15 reps;Green Shoulder Extension: 15 reps;Green Rows: 15 reps;Green Seated Exercises Neck Retraction: 10 reps;5 secs X to V: 10 reps W Back: 10 reps Shoulder Shrugs: 5 reps Other Seated Exercise: 3D neck excursion    Manual Therapy Manual Therapy: Massage Massage: seated upper traps and cervical musculature bilaterally Rt>Lt  Physical Therapy Assessment and Plan PT Assessment and Plan Clinical Impression Statement: Completed established therex with therapist facilitation for form posture.  continued with massage to cervical region.  Pt prefers seated position as pain is higher in cervical region.  Spasms/pain more prominent in Rt cervical region.  Able to decrease 50% with manual techniques.  Pt reportedly had luck with cervical traction in the past so may be an option if pain does not resolve. PT Plan: Pt visits will stress cervical stretches, scapular stabilization exercises and manual techniques to decrease spasm and tightness of cervical and thoracic mm.      Problem List Patient Active Problem List   Diagnosis Date Noted  . Spasm of muscle 07/29/2014  . Stiffness of joints, not elsewhere classified, multiple sites 07/29/2014  . Osteopenia 06/05/2014  . Cervical nerve  root compression 06/05/2014  . Osteoporosis, unspecified 05/24/2013  . Hyperlipemia 05/24/2013  . Migraines 05/03/2013      Teena Irani, PTA/CLT 08/07/2014, 3:18 PM

## 2014-08-11 ENCOUNTER — Ambulatory Visit (HOSPITAL_COMMUNITY)
Admission: RE | Admit: 2014-08-11 | Discharge: 2014-08-11 | Disposition: A | Discharge status: Home / Self Care | Payer: BC Managed Care – PPO | Source: Ambulatory Visit | Attending: Neurosurgery | Admitting: Neurosurgery

## 2014-08-11 DIAGNOSIS — M62838 Other muscle spasm: Secondary | ICD-10-CM

## 2014-08-11 DIAGNOSIS — IMO0001 Reserved for inherently not codable concepts without codable children: Secondary | ICD-10-CM | POA: Diagnosis not present

## 2014-08-11 DIAGNOSIS — M2560 Stiffness of unspecified joint, not elsewhere classified: Secondary | ICD-10-CM

## 2014-08-11 NOTE — Progress Notes (Addendum)
Physical Therapy Treatment Patient Details  Name: Susan Liu MRN: [id number] Date of Birth: 11-Oct-1956  Today's Date: 08/11/2014 Time: 1300-1357 PT Time Calculation (min): 60 min Charge TE 1300-1323, Manual 1325-1338, Cervical traction 1340-1355  Visit#: 4 of 12  Re-eval: 08/28/14 Assessment Diagnosis: cervical pain  Next MD Visit: Kathyrn Sheriff unscheduled  Authorization: [employer]  Authorization Time Period:    Authorization Visit#:   of     Subjective: Symptoms/Limitations Symptoms: Pt stated she woke up this morning feeling the best she has.  Compliant with HEP daily, reports pain reliefed following stretches but seems to come back in 10 minutes.  Pt stated she used to have a home cervical traction unit with good results and was wondering about traction here.   Pain Assessment Currently in Pain?: Yes Pain Score: 2  Pain Location: Neck Pain Orientation: Right;Left  Objective:   Exercise/Treatments Stretches Upper Trapezius Stretch: 3 reps;30 seconds Neck Stretch: 3 reps;30 seconds Seated Exercises Neck Retraction: 10 reps;5 secs X to V: 10 reps Shoulder Shrugs: 10 reps Other Seated Exercise: 3D neck excursion  Modalities Modalities: Traction Manual Therapy Manual Therapy: Massage Massage: seated upper traps and cervical musculature bilaterally Rt>Lt Traction Type of Traction: Cervical Max (lbs): 17 Hold Time: static Rest Time: static  Time: 27' (17' total including setup)  Physical Therapy Assessment and Plan PT Assessment and Plan Clinical Impression Statement: Continued focus on stretches and exercises to improve cervical mobility and posture.  Pt able to demonstrate improved form and technique with cervical retraction with less facilitation required.  Noted improved cervical rotation to Rt following manual to Bil cervical musculature with main focus on Rt upper traps, levator scapula and supraspinatus.  Ended sessoin with cervical traction following discussion  with pt and evaluated PT, statuc cervucak traction.   PT Plan: F/U on traction.  Pt visits will stress cervical stretches, scapular stabilization exercises and manual techniques to decrease spasm and tightness of cervical and thoracic mm.     Goals PT Short Term Goals PT Short Term Goal 1: Pt to have full cervical rotation to the right PT Short Term Goal 1 - Progress: Progressing toward goal PT Short Term Goal 2: Pt to be able to hold head turned to the right for five minutes to engage in conversation at a dinner table. PT Short Term Goal 3: Pain level to be no greater than a 6/10 80% of the day PT Short Term Goal 3 - Progress: Progressing toward goal PT Long Term Goals PT Long Term Goal 1: I in advance HEP PT Long Term Goal 1 - Progress: Progressing toward goal PT Long Term Goal 2: Pt to be able to sweep without increased neck pain  Long Term Goal 3: Pt pain to be no greater than a 3/10 70% of the day. Long Term Goal 4: Pt to have no mm spasms  Long Term Goal 4 Progress: Progressing toward goal  Problem List Patient Active Problem List   Diagnosis Date Noted  . Spasm of muscle 07/29/2014  . Stiffness of joints, not elsewhere classified, multiple sites 07/29/2014  . Osteopenia 06/05/2014  . Cervical nerve root compression 06/05/2014  . Osteoporosis, unspecified 05/24/2013  . Hyperlipemia 05/24/2013  . Migraines 05/03/2013    PT - End of Session Activity Tolerance: Patient tolerated treatment well General Behavior During Therapy: [facility] for tasks assessed/performed  GP    Aldona Lento 08/11/2014, 1:50 PM

## 2014-08-13 ENCOUNTER — Ambulatory Visit (HOSPITAL_COMMUNITY)
Admission: RE | Admit: 2014-08-13 | Discharge: 2014-08-13 | Disposition: A | Discharge status: Home / Self Care | Payer: BC Managed Care – PPO | Source: Ambulatory Visit | Attending: Neurosurgery | Admitting: Neurosurgery

## 2014-08-13 DIAGNOSIS — M62838 Other muscle spasm: Secondary | ICD-10-CM

## 2014-08-13 DIAGNOSIS — IMO0001 Reserved for inherently not codable concepts without codable children: Secondary | ICD-10-CM | POA: Diagnosis not present

## 2014-08-13 DIAGNOSIS — M2560 Stiffness of unspecified joint, not elsewhere classified: Secondary | ICD-10-CM

## 2014-08-13 NOTE — Progress Notes (Signed)
Physical Therapy Treatment Patient Details  Name: Susan Liu MRN: [id number] Date of Birth: 09/19/1956  Today's Date: 08/13/2014 Time: 1350-1435 PT Time Calculation (min): 45 min Charge:   There ex 1350-1410:  Manual [id number]; traction w/HMP [id number] Visit#: 5 of 12  Re-eval: 08/28/14    Authorization: BC[employer]   Subjective: Symptoms/Limitations Symptoms: Pt states she felt the best this morning thatn she has but now has some pain on the Rt side of her neck  Pain Assessment Currently in Pain?: Yes Pain Score: 2  Pain Orientation: Right   Exercise/Treatments    Prone Exercises Axial Exentsion: 10 reps W Back: 10 reps Shoulder Extension: 10 reps;Weights Shoulder Extension Weights (lbs): 2 Rows: 10 reps;Weights Rows Weights (lbs): 2 Other Prone Exercise: horizontal abdcution B   Modalities Modalities: Traction Manual Therapy Manual Therapy: Myofascial release Myofascial Release: to Rt cervial mm working on decreasing tension  Traction Type of Traction: Cervical Max (lbs): 19 Hold Time: static Rest Time: static  Time: 15  Physical Therapy Assessment and Plan PT Assessment and Plan Clinical Impression Statement: Pt completed new stabilization exercises with good form.  Verbalized knowing how and completing stretches at home therefore stretches were not done in clinic.   PT Plan: begin wall push up as well as thoracic stretch     Goals  progressing   Problem List Patient Active Problem List   Diagnosis Date Noted  . Spasm of muscle 07/29/2014  . Stiffness of joints, not elsewhere classified, multiple sites 07/29/2014  . Osteopenia 06/05/2014  . Cervical nerve root compression 06/05/2014  . Osteoporosis, unspecified 05/24/2013  . Hyperlipemia 05/24/2013  . Migraines 05/03/2013       GP    Olivia Pavelko,CINDY 08/13/2014, 2:51 PM

## 2014-08-15 ENCOUNTER — Ambulatory Visit (HOSPITAL_COMMUNITY): Payer: BC Managed Care – PPO | Admitting: Physical Therapy

## 2014-08-18 ENCOUNTER — Encounter: Payer: Self-pay | Admitting: Family Medicine

## 2014-08-18 ENCOUNTER — Other Ambulatory Visit: Payer: Self-pay | Admitting: Family Medicine

## 2014-08-18 ENCOUNTER — Other Ambulatory Visit: Payer: Self-pay | Admitting: Nurse Practitioner

## 2014-08-18 ENCOUNTER — Ambulatory Visit (HOSPITAL_COMMUNITY)
Admission: RE | Admit: 2014-08-18 | Discharge: 2014-08-18 | Disposition: A | Discharge status: Home / Self Care | Payer: BC Managed Care – PPO | Source: Ambulatory Visit | Attending: Physical Therapy | Admitting: Physical Therapy

## 2014-08-18 DIAGNOSIS — IMO0001 Reserved for inherently not codable concepts without codable children: Secondary | ICD-10-CM | POA: Diagnosis not present

## 2014-08-18 NOTE — Progress Notes (Signed)
Physical Therapy Treatment Patient Details  Name: Susan Liu MRN: [id number] Date of Birth: 10-27-1956  Today's Date: 08/18/2014 Time: 7564-3329 PT Time Calculation (min): 43 min TE  [id number]  Visit#: 6 of 12  Re-eval: 08/28/14    Subjective: Symptoms/Limitations Symptoms: Pt reported feeling good after last treatment session, though pain came back that night and through saturday.  By sunday and all of today she reports no complaints of pain!  Pt reprots she has been afraid to do any of her HEP or attempt returning to swimming as she has had no pain and was afraid of aggrevating symptoms.  Pain Assessment Currently in Pain?: No/denies   Exercise/Treatments Stretches Upper Trapezius Stretch: 3 reps;20 seconds Corner Stretch: 3 reps;30 seconds Standing Exercises Other Standing Exercises: Wall Push Ups x10 Seated Exercises Neck Retraction: 15 reps;5 secs Supine Exercises Other Supine Exercise: Thoracic Mobility on Foam Roller (1) T (2) Alternating Fl (3) [place] Other Supine Exercise: Serratus Punches on Foam Roller 1# 2x10 Prone Exercises Shoulder Extension: 15 reps Shoulder Extension Weights (lbs): 2 Rows: 15 reps Rows Weights (lbs): 2 Other Prone Exercise: T,Y 2#, x15     Physical Therapy Assessment and Plan PT Assessment and Plan Clinical Impression Statement: Light stretches performed in clinic as pt reports she has not completed her HEP in two days as she has been painfree and was afraid for pain to return.  Educated pt on importance of gentle stretching/mobility program to prevent muscle tightness/spasming and avoid any loss of ROM.  Exercises performed in prone with good technique and HEP was updated to include prone exericses and wall push ups.   Pt will benefit from skilled therapeutic intervention in order to improve on the following deficits: Pain;Increased fascial restricitons;Impaired flexibility;Increased muscle spasms PT Plan: Begin thoracic stretch.      Goals PT Short Term Goals PT Short Term Goal 1: Pt to have full cervical rotation to the right PT Short Term Goal 1 - Progress: Progressing toward goal PT Short Term Goal 3: Pain level to be no greater than a 6/10 80% of the day PT Short Term Goal 3 - Progress: Progressing toward goal  Problem List Patient Active Problem List   Diagnosis Date Noted  . Spasm of muscle 07/29/2014  . Stiffness of joints, not elsewhere classified, multiple sites 07/29/2014  . Osteopenia 06/05/2014  . Cervical nerve root compression 06/05/2014  . Osteoporosis, unspecified 05/24/2013  . Hyperlipemia 05/24/2013  . Migraines 05/03/2013    PT - End of Session Activity Tolerance: Patient tolerated treatment well General Behavior During Therapy: WFL for tasks assessed/performed PT Plan of Care PT Home Exercise Plan: Updated HEP for corner stretch, prone flexion/row/abduction/extension, and wall push ups   Kincaid Tiger 08/18/2014, 6:09 PM

## 2014-08-20 ENCOUNTER — Other Ambulatory Visit: Payer: Self-pay | Admitting: *Deleted

## 2014-08-20 ENCOUNTER — Ambulatory Visit (HOSPITAL_COMMUNITY)
Admission: RE | Admit: 2014-08-20 | Discharge: 2014-08-20 | Disposition: A | Discharge status: Home / Self Care | Payer: BC Managed Care – PPO | Source: Ambulatory Visit | Attending: Neurosurgery | Admitting: Neurosurgery

## 2014-08-20 DIAGNOSIS — IMO0001 Reserved for inherently not codable concepts without codable children: Secondary | ICD-10-CM | POA: Diagnosis not present

## 2014-08-20 MED ORDER — SUMATRIPTAN SUCCINATE 100 MG PO TABS: ORAL_TABLET | ORAL | Status: DC

## 2014-08-20 MED ORDER — ALENDRONATE SODIUM 70 MG PO TABS: ORAL_TABLET | ORAL | Status: DC

## 2014-08-20 NOTE — Progress Notes (Signed)
Physical Therapy Treatment Patient Details  Name: Susan Liu MRN: [id number] Date of Birth: [birth year]  Today's Date: 08/20/2014 Time: 1302-1340 PT Time Calculation (min): 38 min 37" TE  Visit#: 7 of 12  Re-eval: 08/28/14    Authorization: BCBS  Authorization Time Period:    Authorization Visit#:   of     Subjective: Symptoms/Limitations Symptoms: Patient is pain free today; yesterday she states she had to take 6 ibuprofen throughout the day due to high level of pain Pain Assessment Pain Score: 0-No pain     Exercise/Treatments      Stretches Upper Trapezius Stretch: Limitations (spt painfree did not want to aggrevate today) Corner Stretch: 3 reps;30 seconds Theraband Exercises Scapula Retraction: 15 reps;Green Shoulder Extension: 15 reps;Green Rows: 15 reps;Green Standing Exercises Other Standing Exercises: Wall Push Ups x15 Seated Exercises Neck Retraction: 15 secs Supine Exercises Other Supine Exercise: Serratus Punches on Foam Roller 1# 2x10 Prone Exercises Shoulder Extension: 15 reps Shoulder Extension Weights (lbs): 2 Rows: 15 reps Rows Weights (lbs): 2 Other Prone Exercise: T,Y 2#, x15        Physical Therapy Assessment and Plan PT Assessment and Plan Clinical Impression Statement: Cervical stretches not performed due to patient being pain free today and stretches already completed at home today. continue with scapular stabilization exercises as well as tband exc in sitting (per pt request due to fear of aggrevating a low back pain) with focus on proper sitting posture and activation of TA . PT Plan: Begin thoracic stretch. Continue with PT POC, recheck cervical for tightness as compared to intital eval.    Goals    Problem List Patient Active Problem List   Diagnosis Date Noted  . Spasm of muscle 07/29/2014  . Stiffness of joints, not elsewhere classified, multiple sites 07/29/2014  . Osteopenia 06/05/2014  . Cervical nerve root  compression 06/05/2014  . Osteoporosis, unspecified 05/24/2013  . Hyperlipemia 05/24/2013  . Migraines 05/03/2013    PT - End of Session Activity Tolerance: Patient tolerated treatment well General Behavior During Therapy: WFL for tasks assessed/performed  GP    Leylanie Woodmansee ATKINSO 08/20/2014, 1:45 PM

## 2014-08-21 ENCOUNTER — Encounter: Payer: Self-pay | Admitting: Family Medicine

## 2014-08-22 ENCOUNTER — Ambulatory Visit (HOSPITAL_COMMUNITY)
Admission: RE | Admit: 2014-08-22 | Discharge: 2014-08-22 | Disposition: A | Discharge status: Home / Self Care | Payer: BC Managed Care – PPO | Source: Ambulatory Visit | Attending: Neurosurgery | Admitting: Neurosurgery

## 2014-08-22 DIAGNOSIS — IMO0001 Reserved for inherently not codable concepts without codable children: Secondary | ICD-10-CM | POA: Diagnosis not present

## 2014-08-22 DIAGNOSIS — M2560 Stiffness of unspecified joint, not elsewhere classified: Secondary | ICD-10-CM

## 2014-08-22 DIAGNOSIS — M62838 Other muscle spasm: Secondary | ICD-10-CM

## 2014-08-22 NOTE — Progress Notes (Signed)
Physical Therapy Treatment Patient Details  Name: Susan Liu MRN: [id number] Date of Birth: [birth year]  Today's Date: 08/22/2014 Time: 1302-1343 PT Time Calculation (min): 56 min Charge: TE 2952-8413, Manual 2440-1027  Visit#: 8 of 12  Re-eval: 08/28/14 Assessment Diagnosis: cervical pain  Next MD Visit: Kathyrn Sheriff unscheduled  Authorization: BCBS  Authorization Time Period:    Authorization Visit#:   of     Subjective: Symptoms/Limitations Symptoms: Pt stated she is finding positive results, was rearranging books earlier today and feels pain now on Rt side of shoulder  Pt c/o LBP following prone exercises. Pain Assessment Currently in Pain?: Yes Pain Score: 2  Pain Location: Neck Pain Orientation: Right  Objective:   Exercise/Treatments Stretches Upper Trapezius Stretch: 3 reps;30 seconds Standing Exercises Other Standing Exercises: Wall Push Ups x15 Other Standing Exercises: Shoulder elevation/depression, scapular retract/protract against wall 3x 3 Seated Exercises Neck Retraction: 15 secs Other Seated Exercise: 3D neck excursion Other Seated Exercise: 3D thoracic excursion  Manual Therapy Manual Therapy: Massage Massage: supine upper traps, levator scapula, scalenes Bil Rr>Lt with LE elevated for LBP comfort  Physical Therapy Assessment and Plan PT Assessment and Plan Clinical Impression Statement: Added thoracic excursion to improve thoracic mobility and improve overall posture.  Held prone exercises following  reports of increased lower back pain following sessoin.  Added serratus anterior strengthening to improve scapular mobility with ttherapist faciilitation for proper musculature activation.  Manual techniques complete at end of session to reduce spasms, noted vast improvement with upper trap mobiility, continues to have tight BIl levator scapula.  Pt reports pain reduced at end of sessoin.  Pt reported she liked effects following cerfvical tracion, may  consider resuming that next session.   PT Plan: Continue with PT POC.  Continue manual techniques and/or cervical traction for pain control.      Goals PT Short Term Goals PT Short Term Goal 1: Pt to have full cervical rotation to the right PT Short Term Goal 1 - Progress: Progressing toward goal PT Short Term Goal 2: Pt to be able to hold head turned to the right for five minutes to engage in conversation at a dinner table. PT Short Term Goal 2 - Progress: Progressing toward goal PT Short Term Goal 3: Pain level to be no greater than a 6/10 80% of the day PT Short Term Goal 3 - Progress: Progressing toward goal PT Long Term Goals PT Long Term Goal 1: I in advance HEP PT Long Term Goal 1 - Progress: Progressing toward goal PT Long Term Goal 2: Pt to be able to sweep without increased neck pain  PT Long Term Goal 2 - Progress: Progressing toward goal Long Term Goal 3: Pt pain to be no greater than a 3/10 70% of the day. Long Term Goal 3 Progress: Progressing toward goal Long Term Goal 4: Pt to have no mm spasms  Long Term Goal 4 Progress: Progressing toward goal  Problem List Patient Active Problem List   Diagnosis Date Noted  . Spasm of muscle 07/29/2014  . Stiffness of joints, not elsewhere classified, multiple sites 07/29/2014  . Osteopenia 06/05/2014  . Cervical nerve root compression 06/05/2014  . Osteoporosis, unspecified 05/24/2013  . Hyperlipemia 05/24/2013  . Migraines 05/03/2013    PT - End of Session Activity Tolerance: Patient tolerated treatment well General Behavior During Therapy: [facility] for tasks assessed/performed  GP    Aldona Lento 08/22/2014, 2:22 PM

## 2014-08-25 ENCOUNTER — Ambulatory Visit (HOSPITAL_COMMUNITY)
Admission: RE | Admit: 2014-08-25 | Discharge: 2014-08-25 | Disposition: A | Discharge status: Home / Self Care | Payer: BC Managed Care – PPO | Source: Ambulatory Visit | Attending: Neurosurgery | Admitting: Neurosurgery

## 2014-08-25 DIAGNOSIS — IMO0001 Reserved for inherently not codable concepts without codable children: Secondary | ICD-10-CM | POA: Diagnosis not present

## 2014-08-25 DIAGNOSIS — M2560 Stiffness of unspecified joint, not elsewhere classified: Secondary | ICD-10-CM

## 2014-08-25 DIAGNOSIS — M62838 Other muscle spasm: Secondary | ICD-10-CM

## 2014-08-25 NOTE — Evaluation (Addendum)
Physical Therapy Evaluation  Patient Details  Name: Susan Liu MRN: [id number] Date of Birth: 14-Dec-1955  Today's Date: 08/25/2014 Time: 0722-5750 PT Time Calculation (min): 37 min Charge:  Mm test, ROM 5183-3582; ;massage 1330-1340              Visit#: 9 of 9  Re-eval: 08/28/14 Assessment Diagnosis: cervical pain  Next MD Visit: Kathyrn Sheriff unscheduled  Authorization: BCBS      Past Medical History:  Past Medical History  Diagnosis Date  . Heart murmur 1993  . Osteoporosis   . Migraine    Past Surgical History:  Past Surgical History  Procedure Laterality Date  . Colonoscopy    . Cesarean section      x2    Subjective Symptoms/Limitations Symptoms: Pt states she is doing very well.  States that her visits on her insurance for  therapy is at it's max today.   Pain Assessment Currently in Pain?: No/denies     Assessment Cervical AROM Cervical Flexion: wnl Cervical Extension: wnl Cervical - Right Side Bend: wnl Cervical - Left Side Bend: wnl Cervical - Right Rotation: wfl was decreased 25%  Cervical - Left Rotation: wnl  Cervical Strength Cervical Extension: 5/5 Cervical - Right Side Bend: 5/5 was  4/5 Cervical - Left Side Bend: 5/5 Palpation Palpation: no tightness noted pt had tightness throughout   foto from 46 to 57  Exercise/Treatments      Stretches Other Neck Stretches: thoracic stretch x 10  Machines for Strengthening UBE (Upper Arm Bike): backward x 5'     Manual Therapy Massage: massage to cervical and upper thoracic with spasm noted along Rt paraspinal mm in thoracic area.  Physical Therapy Assessment and Plan PT Assessment and Plan Clinical Impression Statement: Pt has not complaint of pain and is I with exercises.  Goals have been met except for a spasm noted in thoracic area but explained to pt that everyone has spasms somewhere the issue is whether it is causing her incresed pain.  Pt is painfree and ready for discharge.  Pt will  benefit from skilled therapeutic intervention in order to improve on the following deficits: Pain;Increased fascial restricitons;Impaired flexibility;Increased muscle spasms PT Plan: discharge to HEP     Goals PT Short Term Goals Time to Complete Short Term Goals: 2 weeks PT Short Term Goal 1: Pt to have full cervical rotation to the right PT Short Term Goal 1 - Progress: Met PT Short Term Goal 2: Pt to be able to hold head turned to the right for five minutes to engage in conversation at a dinner table. PT Short Term Goal 2 - Progress: Met PT Short Term Goal 3: Pain level to be no greater than a 6/10 80% of the day PT Short Term Goal 3 - Progress: Met PT Long Term Goals Time to Complete Long Term Goals: 4 weeks PT Long Term Goal 1: I in advance HEP PT Long Term Goal 1 - Progress: Met PT Long Term Goal 2: Pt to be able to sweep without increased neck pain  PT Long Term Goal 2 - Progress: Met Long Term Goal 3: Pt pain to be no greater than a 3/10 70% of the day. Long Term Goal 3 Progress: Met Long Term Goal 4: Pt to have no mm spasms  Long Term Goal 4 Progress: Progressing toward goal  Problem List Patient Active Problem List   Diagnosis Date Noted  . Spasm of muscle 07/29/2014  . Stiffness of joints, not elsewhere  classified, multiple sites 07/29/2014  . Osteopenia 06/05/2014  . Cervical nerve root compression 06/05/2014  . Osteoporosis, unspecified 05/24/2013  . Hyperlipemia 05/24/2013  . Migraines 05/03/2013       GP    RUSSELL,CINDY 08/25/2014, 5:41 PM  Physician Documentation Your signature is required to indicate approval of the treatment plan as stated above.  Please sign and either send electronically or make a copy of this report for your files and return this physician signed original.   Please mark one 1.__approve of plan  2. ___approve of plan with the following conditions.   ______________________________                                                           _____________________ Physician Signature                                                                                                             Date

## 2014-08-26 ENCOUNTER — Encounter: Payer: Self-pay | Admitting: Family Medicine

## 2014-08-26 ENCOUNTER — Other Ambulatory Visit: Payer: Self-pay | Admitting: *Deleted

## 2014-08-26 DIAGNOSIS — R5381 Other malaise: Secondary | ICD-10-CM

## 2014-08-26 DIAGNOSIS — R5383 Other fatigue: Secondary | ICD-10-CM

## 2014-08-26 DIAGNOSIS — M810 Age-related osteoporosis without current pathological fracture: Secondary | ICD-10-CM

## 2014-08-26 DIAGNOSIS — Z79899 Other long term (current) drug therapy: Secondary | ICD-10-CM

## 2014-08-26 DIAGNOSIS — E785 Hyperlipidemia, unspecified: Secondary | ICD-10-CM

## 2014-08-27 ENCOUNTER — Ambulatory Visit (HOSPITAL_COMMUNITY): Payer: BC Managed Care – PPO | Admitting: Physical Therapy

## 2014-08-29 ENCOUNTER — Ambulatory Visit (HOSPITAL_COMMUNITY): Payer: BC Managed Care – PPO | Admitting: Physical Therapy

## 2014-08-29 ENCOUNTER — Encounter: Payer: Self-pay | Admitting: Family Medicine

## 2014-09-01 ENCOUNTER — Ambulatory Visit (HOSPITAL_COMMUNITY): Payer: BC Managed Care – PPO

## 2014-09-03 ENCOUNTER — Ambulatory Visit (HOSPITAL_COMMUNITY): Payer: BC Managed Care – PPO | Admitting: Physical Therapy

## 2014-09-05 ENCOUNTER — Ambulatory Visit (HOSPITAL_COMMUNITY): Payer: BC Managed Care – PPO | Admitting: Physical Therapy

## 2014-09-08 ENCOUNTER — Ambulatory Visit (HOSPITAL_COMMUNITY): Payer: BC Managed Care – PPO | Admitting: Physical Therapy

## 2014-09-10 ENCOUNTER — Ambulatory Visit (HOSPITAL_COMMUNITY): Payer: BC Managed Care – PPO | Admitting: Physical Therapy

## 2014-09-12 ENCOUNTER — Ambulatory Visit (HOSPITAL_COMMUNITY): Payer: BC Managed Care – PPO | Admitting: Physical Therapy

## 2014-09-15 ENCOUNTER — Ambulatory Visit (HOSPITAL_COMMUNITY): Payer: BC Managed Care – PPO | Admitting: Physical Therapy

## 2014-10-10 ENCOUNTER — Other Ambulatory Visit: Payer: Self-pay | Admitting: Family Medicine

## 2014-11-04 ENCOUNTER — Other Ambulatory Visit: Payer: Self-pay | Admitting: *Deleted

## 2014-11-04 DIAGNOSIS — Z0000 Encounter for general adult medical examination without abnormal findings: Secondary | ICD-10-CM

## 2014-11-04 LAB — POC HEMOCCULT BLD/STL (HOME/3-CARD/SCREEN)
Card #2 Fecal Occult Blod, POC: NEGATIVE
Card #3 Fecal Occult Blood, POC: NEGATIVE
Fecal Occult Blood, POC: NEGATIVE

## 2014-11-26 ENCOUNTER — Ambulatory Visit: Payer: BC Managed Care – PPO | Admitting: Family Medicine

## 2014-12-03 ENCOUNTER — Encounter: Payer: Self-pay | Admitting: Family Medicine

## 2014-12-03 ENCOUNTER — Ambulatory Visit (INDEPENDENT_AMBULATORY_CARE_PROVIDER_SITE_OTHER): Payer: BLUE CROSS/BLUE SHIELD | Admitting: Family Medicine

## 2014-12-03 VITALS — BP 102/70 | Ht 60.5 in | Wt 121.5 lb

## 2014-12-03 DIAGNOSIS — G43009 Migraine without aura, not intractable, without status migrainosus: Secondary | ICD-10-CM

## 2014-12-03 DIAGNOSIS — M810 Age-related osteoporosis without current pathological fracture: Secondary | ICD-10-CM

## 2014-12-03 DIAGNOSIS — M549 Dorsalgia, unspecified: Secondary | ICD-10-CM

## 2014-12-03 DIAGNOSIS — G542 Cervical root disorders, not elsewhere classified: Secondary | ICD-10-CM

## 2014-12-03 NOTE — Progress Notes (Signed)
   Subjective:    Patient ID: Susan Liu, female    DOB: 06/08/1956, 59 y.o.   MRN: [id number]  Hyperlipidemia This is a chronic problem. The current episode started more than 1 year ago. The problem is controlled. Recent lipid tests were reviewed and are normal. There are no known factors aggravating her hyperlipidemia. Current antihyperlipidemic treatment includes statins. The current treatment provides significant improvement of lipids. There are no compliance problems.  There are no known risk factors for coronary artery disease.   Patient states that she wakes up with headaches. She would like to discuss treatment options.  We talked about this at length. See discussion below. 25 minutes to 30 minutes spent with patient covering these multiple issues in multiple questions  Patient has a spot on her lip that she would like the doctor to look at.    Review of Systems She relates neck pain she relates headaches she states some discomfort into the trapezius denies chest tightness pressure pain shortness of breath. She does relate intermittent low back pain.    Objective:   Physical Exam  Subjective discomfort in upper portion of her neck and upper portion of her back her lungs are clear hearts regular pulse normal extremities no edema blood pressure good      Assessment & Plan:  #1 hyperlipidemia continue current medication. Recent lab work that was done through the [place] overall looked good  #2 intermittent headaches I believe that these are related to the problem is going on with her neck she had MRI this morning she follows up with neurosurgeon in a couple weeks there is a possibility they may be referring her for surgery she will let us know  #3 if it is not felt that these headaches are related to her neck condition the next step would be referral to neurology for further evaluation of the headaches. Currently right now she may use Imitrex but long-term it is not a good  idea   #4 she has area on the lip that is concerning for the possibility of early skin cancer we will refer to rheumatology  #5 patient is to continue to eat healthy stay physically active  #6 osteoporosis discussed in detail continue Fosamax calcium vitamin D. Repeat bone study in a couple years

## 2014-12-06 ENCOUNTER — Other Ambulatory Visit: Payer: Self-pay | Admitting: Family Medicine

## 2014-12-06 ENCOUNTER — Encounter: Payer: Self-pay | Admitting: Family Medicine

## 2014-12-08 ENCOUNTER — Other Ambulatory Visit: Payer: Self-pay | Admitting: Family Medicine

## 2014-12-08 ENCOUNTER — Other Ambulatory Visit: Payer: Self-pay | Admitting: *Deleted

## 2014-12-08 MED ORDER — PRAVASTATIN SODIUM 80 MG PO TABS: ORAL_TABLET | ORAL | Status: DC

## 2014-12-09 NOTE — Telephone Encounter (Signed)
Please make sure patient does get her 90 day prescription with 2 refills then notify the patient please thank you

## 2014-12-10 ENCOUNTER — Encounter: Payer: Self-pay | Admitting: Family Medicine

## 2014-12-11 ENCOUNTER — Encounter: Payer: Self-pay | Admitting: Family Medicine

## 2014-12-11 ENCOUNTER — Ambulatory Visit (INDEPENDENT_AMBULATORY_CARE_PROVIDER_SITE_OTHER): Payer: BLUE CROSS/BLUE SHIELD | Admitting: Family Medicine

## 2014-12-11 VITALS — BP 100/70 | Temp 98.2°F | Ht 60.5 in | Wt 122.4 lb

## 2014-12-11 DIAGNOSIS — M791 Myalgia, unspecified site: Secondary | ICD-10-CM

## 2014-12-11 DIAGNOSIS — J029 Acute pharyngitis, unspecified: Secondary | ICD-10-CM

## 2014-12-11 LAB — POCT RAPID STREP A (OFFICE): Rapid Strep A Screen: NEGATIVE

## 2014-12-11 MED ORDER — AZITHROMYCIN 250 MG PO TABS: ORAL_TABLET | ORAL | Status: DC

## 2014-12-11 NOTE — Progress Notes (Signed)
   Subjective:    Patient ID: Susan Liu, female    DOB: [birth year], 59 y.o.   MRN: [id number]  Sore Throat  This is a new problem. The current episode started yesterday. The problem has been unchanged. Neither side of throat is experiencing more pain than the other. There has been no fever. The pain is moderate. She has tried NSAIDs for the symptoms. The treatment provided no relief.   Patient states that she has a muscle on her left shoulder that is very sore. She would like the doctor to take a look at this too.   Nasal cong estion and clear   Very irrit and inflammed, with some frontal headache. Diminished energy. No high fevers    Review of Systems No vomiting no nausea no diarrhea no abdominal pain    Objective:   Physical Exam  Alert mild malaise pharynx erythematous tender anterior nodes neck supple left suprascapular nodular palpable region very tender. Lungs clear. Heart regular in rhythm.      Assessment & Plan:  Impression 1 pharyngitis with cervical lymphadenitis #2 supraclavicular strain with element of spasm plan physical therapy consult per patient request. Z-Pak. Since Medicare discussed. WSL

## 2014-12-15 ENCOUNTER — Encounter: Payer: Self-pay | Admitting: Family Medicine

## 2014-12-15 ENCOUNTER — Other Ambulatory Visit: Payer: Self-pay | Admitting: *Deleted

## 2014-12-15 MED ORDER — PRAVASTATIN SODIUM 80 MG PO TABS: ORAL_TABLET | ORAL | Status: DC

## 2014-12-24 ENCOUNTER — Encounter: Payer: Self-pay | Admitting: Family Medicine

## 2014-12-24 DIAGNOSIS — Q062 Diastematomyelia: Secondary | ICD-10-CM | POA: Insufficient documentation

## 2015-01-02 ENCOUNTER — Other Ambulatory Visit: Payer: Self-pay | Admitting: Family Medicine

## 2015-02-02 ENCOUNTER — Other Ambulatory Visit: Payer: Self-pay | Admitting: *Deleted

## 2015-02-02 MED ORDER — PRAVASTATIN SODIUM 80 MG PO TABS: ORAL_TABLET | ORAL | Status: DC

## 2015-05-21 ENCOUNTER — Ambulatory Visit (INDEPENDENT_AMBULATORY_CARE_PROVIDER_SITE_OTHER): Payer: BLUE CROSS/BLUE SHIELD | Admitting: Family Medicine

## 2015-05-21 ENCOUNTER — Encounter: Payer: Self-pay | Admitting: Family Medicine

## 2015-05-21 VITALS — BP 118/76 | Ht 60.5 in | Wt 121.0 lb

## 2015-05-21 DIAGNOSIS — L819 Disorder of pigmentation, unspecified: Secondary | ICD-10-CM | POA: Diagnosis not present

## 2015-05-21 DIAGNOSIS — E785 Hyperlipidemia, unspecified: Secondary | ICD-10-CM | POA: Diagnosis not present

## 2015-05-21 MED ORDER — PRAVASTATIN SODIUM 80 MG PO TABS: ORAL_TABLET | ORAL | Status: DC

## 2015-05-21 NOTE — Progress Notes (Signed)
   Subjective:    Patient ID: Susan Liu, female    DOB: [birth year], 59 y.o.   MRN: [id number]  Hyperlipidemia This is a chronic problem. The current episode started more than 1 year ago. Treatments tried: pravastatin. There are no compliance problems (walks and swims,  at least 5 days a week, eats healthy).    Seeing Dr. Carloyn Manner for neck pain. Takes ibuprofen for the pain. Pt would like to discuss getting steroid injection. Has a follow up with Dr. Carloyn Manner next Tuesday.   Pt has appt next month for physical with Hoyle Sauer.    Review of Systems Denies any problems with her cholesterol medicine denies any problem with osteoporosis medicine. No vomiting diarrhea chest pain or reflux    Objective:   Physical Exam Lungs clear heart regular pulse normal BP good   Patient had recent visit with dermatology that showed a couple small and normal moles these were removed it was recommended for her to follow-up in 6 months which is close to now she would prefer to have that completed here    Assessment & Plan:  Skin scan with Hoyle Sauer at the follow up-given her history of atypical moles I would recommend skin examination with Hoyle Sauer at the time of her physical also recommend that the patient follow-up 6 months after that with dermatology  Hyperlipidemia continue medication  Chronic neck pain will be seen specialist possible injections  Osteoporosis continue medication tolerating it well bone scan next year

## 2015-05-21 NOTE — Addendum Note (Signed)
Addended by: Sallee Lange A on: 05/21/2015 05:26 PM   Modules accepted: Level of Service

## 2015-06-11 ENCOUNTER — Encounter: Payer: Self-pay | Admitting: Nurse Practitioner

## 2015-06-11 ENCOUNTER — Ambulatory Visit (INDEPENDENT_AMBULATORY_CARE_PROVIDER_SITE_OTHER): Payer: BLUE CROSS/BLUE SHIELD | Admitting: Nurse Practitioner

## 2015-06-11 VITALS — BP 116/78 | Ht 60.5 in | Wt 123.0 lb

## 2015-06-11 DIAGNOSIS — Z124 Encounter for screening for malignant neoplasm of cervix: Secondary | ICD-10-CM | POA: Diagnosis not present

## 2015-06-11 DIAGNOSIS — Z0000 Encounter for general adult medical examination without abnormal findings: Secondary | ICD-10-CM

## 2015-06-11 DIAGNOSIS — Z01419 Encounter for gynecological examination (general) (routine) without abnormal findings: Secondary | ICD-10-CM

## 2015-06-11 MED ORDER — ALENDRONATE SODIUM 70 MG PO TABS: ORAL_TABLET | ORAL | Status: DC

## 2015-06-15 ENCOUNTER — Encounter: Payer: Self-pay | Admitting: Nurse Practitioner

## 2015-06-15 NOTE — Progress Notes (Signed)
   Subjective:    Patient ID: Susan Liu, female    DOB: 1956-06-04, 59 y.o.   MRN: [id number]  HPI presents for her wellness exam. Married, same sexual partner. No vaginal bleeding or pelvic pain. Healthy diet. Regular activity. Regular vision and dental exams. Regular skin cancer screenings.     Review of Systems  Constitutional: Negative for activity change, appetite change and fatigue.  HENT: Negative for dental problem, ear pain, sinus pressure and sore throat.   Respiratory: Negative for cough, chest tightness, shortness of breath and wheezing.   Cardiovascular: Negative for chest pain.  Gastrointestinal: Negative for nausea, vomiting, abdominal pain, diarrhea, constipation, blood in stool and abdominal distention.  Genitourinary: Negative for dysuria, urgency, frequency, vaginal bleeding, vaginal discharge, enuresis, difficulty urinating, genital sores and pelvic pain.  Psychiatric/Behavioral: Negative for sleep disturbance.       Objective:   Physical Exam  Constitutional: She is oriented to person, place, and time. She appears well-developed. No distress.  HENT:  Right Ear: External ear normal.  Left Ear: External ear normal.  Mouth/Throat: Oropharynx is clear and moist.  Neck: Normal range of motion. Neck supple. No tracheal deviation present. No thyromegaly present.  Cardiovascular: Normal rate, regular rhythm and normal heart sounds.  Exam reveals no gallop.   No murmur heard. Pulmonary/Chest: Effort normal and breath sounds normal.  Abdominal: Soft. She exhibits no distension. There is no tenderness.  Genitourinary: Vagina normal and uterus normal. No vaginal discharge found.  External GU: pale, no lesions or rash. Vagina pale with clear discharge. Cervix normal in appearance; no CMT. Bimanual exam: no tenderness or obvious masses. Rectal exam: no masses; no stool for hemoccult.   Musculoskeletal: She exhibits no edema.  Lymphadenopathy:    She has no cervical  adenopathy.  Neurological: She is alert and oriented to person, place, and time.  Skin: Skin is warm and dry. No rash noted.  Psychiatric: She has a normal mood and affect. Her behavior is normal.  Vitals reviewed. breast exam: areas of dense tissue; minimal fine nodularity; no dominant masses; axillae no adenopathy.        Assessment & Plan:  Well woman exam - Plan: Pap IG w/ reflex to HPV when ASC-U  Screening for cervical cancer - Plan: Pap IG w/ reflex to HPV when ASC-U  Plans to schedule her own mammogram. Hemoccult cards ordered. Recommend daily vitamin D and calcium supplement.  Return in about 6 months (around 12/12/2015) for recheck. Next PE in one year.

## 2015-06-16 LAB — PAP IG W/ RFLX HPV ASCU: PAP Smear Comment: 0

## 2015-06-18 ENCOUNTER — Other Ambulatory Visit: Payer: Self-pay | Admitting: Family Medicine

## 2015-06-25 ENCOUNTER — Other Ambulatory Visit: Payer: Self-pay | Admitting: Family Medicine

## 2015-06-25 DIAGNOSIS — Z1231 Encounter for screening mammogram for malignant neoplasm of breast: Secondary | ICD-10-CM

## 2015-07-01 ENCOUNTER — Ambulatory Visit (HOSPITAL_COMMUNITY): Payer: Self-pay

## 2015-07-03 ENCOUNTER — Ambulatory Visit (HOSPITAL_COMMUNITY)
Admission: RE | Admit: 2015-07-03 | Discharge: 2015-07-03 | Disposition: A | Discharge status: Home / Self Care | Payer: BLUE CROSS/BLUE SHIELD | Source: Ambulatory Visit | Attending: Family Medicine | Admitting: Family Medicine

## 2015-07-03 DIAGNOSIS — Z1231 Encounter for screening mammogram for malignant neoplasm of breast: Secondary | ICD-10-CM

## 2015-10-20 ENCOUNTER — Other Ambulatory Visit: Payer: Self-pay | Admitting: Family Medicine

## 2015-12-07 ENCOUNTER — Encounter: Payer: Self-pay | Admitting: Nurse Practitioner

## 2016-01-18 ENCOUNTER — Other Ambulatory Visit: Payer: Self-pay | Admitting: Family Medicine

## 2016-01-28 ENCOUNTER — Encounter: Payer: Self-pay | Admitting: Nurse Practitioner

## 2016-01-29 NOTE — Telephone Encounter (Signed)
Please order Met 7, CBC with diff, lipid profile, liver profile, TSH and vit D level. Patient needs written order to have labs done through county. Thanks.

## 2016-02-03 ENCOUNTER — Other Ambulatory Visit: Payer: Self-pay

## 2016-02-03 DIAGNOSIS — E785 Hyperlipidemia, unspecified: Secondary | ICD-10-CM

## 2016-02-03 DIAGNOSIS — Z139 Encounter for screening, unspecified: Secondary | ICD-10-CM

## 2016-02-15 ENCOUNTER — Encounter: Payer: Self-pay | Admitting: Nurse Practitioner

## 2016-02-17 ENCOUNTER — Encounter: Payer: Self-pay | Admitting: Family Medicine

## 2016-02-17 ENCOUNTER — Ambulatory Visit (INDEPENDENT_AMBULATORY_CARE_PROVIDER_SITE_OTHER): Payer: BLUE CROSS/BLUE SHIELD | Admitting: Family Medicine

## 2016-02-17 VITALS — Temp 98.0°F | Ht 60.5 in | Wt 121.6 lb

## 2016-02-17 DIAGNOSIS — J019 Acute sinusitis, unspecified: Secondary | ICD-10-CM

## 2016-02-17 DIAGNOSIS — J069 Acute upper respiratory infection, unspecified: Secondary | ICD-10-CM

## 2016-02-17 DIAGNOSIS — B9689 Other specified bacterial agents as the cause of diseases classified elsewhere: Secondary | ICD-10-CM

## 2016-02-17 MED ORDER — CEFDINIR 300 MG PO CAPS: 300.0000 mg | ORAL_CAPSULE | Freq: Two times a day (BID) | ORAL | Status: DC

## 2016-02-17 NOTE — Progress Notes (Signed)
   Subjective:    Patient ID: Susan Liu, female    DOB: 07/19/1956, 60 y.o.   MRN: [id number]  Cough This is a new problem. The current episode started in the past 7 days. Associated symptoms include nasal congestion and a sore throat. Treatments tried: mucinex.   Patient*to offer burning in the nose running nose sore throat then progressed into sinus pressure pain discomfort denies vomiting diarrhea   Review of Systems  HENT: Positive for sore throat.   Respiratory: Positive for cough.    Patient was sinus pressure pain discomfort discolored drainage postnasal drip    Objective:   Physical Exam Mild sinus tenderness nostrils normal eardrums normal throat normal neck supple lungs clear heart       Assessment & Plan:  Viral syndrome Secondary rhinosinusitis Patient was seen today for upper respiratory illness. It is felt that the patient is dealing with sinusitis. Antibiotics were prescribed today. Importance of compliance with medication was discussed. Symptoms should gradually resolve over the course of the next several days. If high fevers, progressive illness, difficulty breathing, worsening condition or failure for symptoms to improve over the next several days then the patient is to follow-up. If any emergent conditions the patient is to follow-up in the emergency department otherwise to follow-up in the office.

## 2016-02-22 ENCOUNTER — Encounter: Payer: Self-pay | Admitting: Family Medicine

## 2016-02-23 ENCOUNTER — Other Ambulatory Visit: Payer: Self-pay | Admitting: Family Medicine

## 2016-02-25 ENCOUNTER — Encounter: Payer: Self-pay | Admitting: Nurse Practitioner

## 2016-02-29 ENCOUNTER — Other Ambulatory Visit: Payer: Self-pay | Admitting: Family Medicine

## 2016-03-02 ENCOUNTER — Encounter: Payer: Self-pay | Admitting: Family Medicine

## 2016-03-10 ENCOUNTER — Telehealth: Payer: Self-pay | Admitting: Family Medicine

## 2016-03-10 MED ORDER — AMOXICILLIN-POT CLAVULANATE 875-125 MG PO TABS: 1.0000 | ORAL_TABLET | Freq: Two times a day (BID) | ORAL | Status: AC

## 2016-03-10 NOTE — Telephone Encounter (Signed)
Patient was seen on 3/22 with a viral infection and stating still has a real bad sore throat, no fever and wanting something else called into [facility].She was prescribed omnicef 300 mg on her last visit.

## 2016-03-10 NOTE — Telephone Encounter (Signed)
Rx sent electronically to pharmacy. Patient notified. 

## 2016-03-10 NOTE — Telephone Encounter (Signed)
Aug 875 bid ten d 

## 2016-04-07 ENCOUNTER — Other Ambulatory Visit: Payer: Self-pay | Admitting: Family Medicine

## 2016-04-11 ENCOUNTER — Other Ambulatory Visit: Payer: Self-pay | Admitting: *Deleted

## 2016-04-11 MED ORDER — ALENDRONATE SODIUM 70 MG PO TABS: ORAL_TABLET | ORAL | Status: DC

## 2016-05-19 ENCOUNTER — Ambulatory Visit (INDEPENDENT_AMBULATORY_CARE_PROVIDER_SITE_OTHER): Payer: BLUE CROSS/BLUE SHIELD | Admitting: Family Medicine

## 2016-05-19 ENCOUNTER — Encounter: Payer: Self-pay | Admitting: Family Medicine

## 2016-05-19 VITALS — BP 128/80 | Ht 60.5 in | Wt 123.0 lb

## 2016-05-19 DIAGNOSIS — M25561 Pain in right knee: Secondary | ICD-10-CM | POA: Diagnosis not present

## 2016-05-19 MED ORDER — PREDNISONE 20 MG PO TABS: ORAL_TABLET | ORAL | Status: DC

## 2016-05-19 NOTE — Progress Notes (Signed)
   Subjective:    Patient ID: Susan Liu, female    DOB: 04-08-1956, 60 y.o.   MRN: [id number]  Leg Pain  Incident onset: one month ago. Pain location: right leg and right knee. She has tried acetaminophen for the symptoms. The treatment provided no relief.   Does not know of any injury but states at one time he did pop she denies locking or giving way. She denies any other particular underlying issues. Denies calf pain or swelling.   Review of Systems Describes knee pain discomfort especially when she squats all the way down soreness on the right side of the knee denies thigh pain calf pain.    Objective:   Physical Exam On physical exam the thigh the calf are normal there is no swelling noted. No crepitus in the knee ligaments thirst stable subjected discomfort on the lateral portion of the right knee. Left leg normal.       Assessment & Plan:  Knee pain-probably a tendinitis possible IT band contribution recommend prednisone short course then increase meloxicam 7.5 mg twice a day for the next 30 days if ongoing troubles over the next few weeks then I recommend referral to orthopedics the patient will let us know

## 2016-06-16 ENCOUNTER — Ambulatory Visit (INDEPENDENT_AMBULATORY_CARE_PROVIDER_SITE_OTHER): Payer: BLUE CROSS/BLUE SHIELD | Admitting: Nurse Practitioner

## 2016-06-16 ENCOUNTER — Encounter: Payer: Self-pay | Admitting: Nurse Practitioner

## 2016-06-16 VITALS — BP 110/78 | Ht 60.0 in | Wt 123.2 lb

## 2016-06-16 DIAGNOSIS — M810 Age-related osteoporosis without current pathological fracture: Secondary | ICD-10-CM

## 2016-06-16 DIAGNOSIS — Z0000 Encounter for general adult medical examination without abnormal findings: Secondary | ICD-10-CM

## 2016-06-16 DIAGNOSIS — Z01419 Encounter for gynecological examination (general) (routine) without abnormal findings: Secondary | ICD-10-CM

## 2016-06-17 ENCOUNTER — Encounter: Payer: Self-pay | Admitting: Nurse Practitioner

## 2016-06-17 DIAGNOSIS — M810 Age-related osteoporosis without current pathological fracture: Secondary | ICD-10-CM | POA: Insufficient documentation

## 2016-06-17 NOTE — Progress Notes (Signed)
   Subjective:    Patient ID: Susan Liu, female    DOB: 1956-07-05, 60 y.o.   MRN: [id number]  HPI presents for her wellness exam. No vaginal bleeding or pelvic pain. Same sexual partner. Regular activity. Overall healthy diet. Regular vision and dental exams.     Review of Systems  Constitutional: Negative for activity change, appetite change and fatigue.  HENT: Negative for dental problem, ear pain, sinus pressure and sore throat.   Respiratory: Negative for cough, chest tightness, shortness of breath and wheezing.   Cardiovascular: Negative for chest pain.  Gastrointestinal: Negative for nausea, vomiting, abdominal pain, diarrhea, constipation and abdominal distention.  Genitourinary: Negative for dysuria, urgency, frequency, vaginal bleeding, vaginal discharge, enuresis, difficulty urinating, genital sores and pelvic pain.       Objective:   Physical Exam  Constitutional: She is oriented to person, place, and time. She appears well-developed. No distress.  HENT:  Right Ear: External ear normal.  Left Ear: External ear normal.  Mouth/Throat: Oropharynx is clear and moist.  Neck: Normal range of motion. Neck supple. No tracheal deviation present. No thyromegaly present.  Cardiovascular: Normal rate, regular rhythm and normal heart sounds.  Exam reveals no gallop.   No murmur heard. Pulmonary/Chest: Effort normal and breath sounds normal.  Abdominal: Soft. She exhibits no distension. There is no tenderness.  Genitourinary: Vagina normal and uterus normal. No vaginal discharge found.  External GU: no rashes or lesions. Vagina: no discharge; pale mucosa. Cervix normal in appearance; no CMT. Bimanual exam: no tenderness or obvious masses. Rectal exam: no masses; no stool for hemoccult.   Musculoskeletal: She exhibits no edema.  Lymphadenopathy:    She has no cervical adenopathy.  Neurological: She is alert and oriented to person, place, and time.  Skin: Skin is warm and dry. No  rash noted.  Psychiatric: She has a normal mood and affect. Her behavior is normal.  Vitals reviewed. Breast exam: slightly dense tissue; no masses; axillae no adenopathy.         Assessment & Plan:   Problem List Items Addressed This Visit      Musculoskeletal and Integument   Osteoporosis   Relevant Orders   DG Bone Density    Other Visit Diagnoses    Well woman exam    -  Primary      Given note to get Hep C testing through work which is free. Hold on hemoccult cards for now due to Mobic use. Plans to schedule her own mammogram.  Return in about 1 year (around 06/16/2017) for physical.

## 2016-06-23 ENCOUNTER — Other Ambulatory Visit: Payer: Self-pay | Admitting: Nurse Practitioner

## 2016-06-23 ENCOUNTER — Ambulatory Visit (HOSPITAL_COMMUNITY)
Admission: RE | Admit: 2016-06-23 | Discharge: 2016-06-23 | Disposition: A | Discharge status: Home / Self Care | Payer: BLUE CROSS/BLUE SHIELD | Source: Ambulatory Visit | Attending: Nurse Practitioner | Admitting: Nurse Practitioner

## 2016-06-23 DIAGNOSIS — M810 Age-related osteoporosis without current pathological fracture: Secondary | ICD-10-CM | POA: Insufficient documentation

## 2016-06-23 DIAGNOSIS — Z1231 Encounter for screening mammogram for malignant neoplasm of breast: Secondary | ICD-10-CM

## 2016-06-25 ENCOUNTER — Other Ambulatory Visit: Payer: Self-pay | Admitting: Family Medicine

## 2016-06-29 ENCOUNTER — Ambulatory Visit (HOSPITAL_COMMUNITY): Payer: BLUE CROSS/BLUE SHIELD

## 2016-06-29 ENCOUNTER — Other Ambulatory Visit: Payer: Self-pay

## 2016-06-29 MED ORDER — ALENDRONATE SODIUM 70 MG PO TABS: ORAL_TABLET | ORAL | 1 refills | Status: DC

## 2016-06-29 MED ORDER — PRAVASTATIN SODIUM 80 MG PO TABS: ORAL_TABLET | ORAL | 1 refills | Status: DC

## 2016-07-07 ENCOUNTER — Encounter: Payer: Self-pay | Admitting: Family Medicine

## 2016-07-07 ENCOUNTER — Other Ambulatory Visit: Payer: Self-pay | Admitting: Nurse Practitioner

## 2016-07-07 ENCOUNTER — Encounter: Payer: Self-pay | Admitting: Nurse Practitioner

## 2016-07-07 DIAGNOSIS — M25561 Pain in right knee: Secondary | ICD-10-CM

## 2016-07-08 ENCOUNTER — Encounter: Payer: Self-pay | Admitting: Nurse Practitioner

## 2016-07-08 ENCOUNTER — Ambulatory Visit (HOSPITAL_COMMUNITY)
Admission: RE | Admit: 2016-07-08 | Discharge: 2016-07-08 | Disposition: A | Discharge status: Home / Self Care | Payer: BLUE CROSS/BLUE SHIELD | Source: Ambulatory Visit | Attending: Nurse Practitioner | Admitting: Nurse Practitioner

## 2016-07-08 DIAGNOSIS — M25561 Pain in right knee: Secondary | ICD-10-CM | POA: Insufficient documentation

## 2016-07-13 ENCOUNTER — Other Ambulatory Visit: Payer: Self-pay | Admitting: Nurse Practitioner

## 2016-07-13 DIAGNOSIS — M25561 Pain in right knee: Secondary | ICD-10-CM

## 2016-07-22 ENCOUNTER — Other Ambulatory Visit: Payer: Self-pay | Admitting: Family Medicine

## 2016-08-26 ENCOUNTER — Encounter: Payer: Self-pay | Admitting: Family Medicine

## 2016-08-26 ENCOUNTER — Ambulatory Visit (INDEPENDENT_AMBULATORY_CARE_PROVIDER_SITE_OTHER): Payer: BLUE CROSS/BLUE SHIELD | Admitting: Family Medicine

## 2016-08-26 VITALS — BP 132/88 | Temp 98.5°F | Ht 60.0 in | Wt 122.0 lb

## 2016-08-26 DIAGNOSIS — J019 Acute sinusitis, unspecified: Secondary | ICD-10-CM | POA: Diagnosis not present

## 2016-08-26 DIAGNOSIS — B9689 Other specified bacterial agents as the cause of diseases classified elsewhere: Secondary | ICD-10-CM

## 2016-08-26 MED ORDER — CEFDINIR 300 MG PO CAPS: 300.0000 mg | ORAL_CAPSULE | Freq: Two times a day (BID) | ORAL | 0 refills | Status: DC

## 2016-08-26 NOTE — Progress Notes (Signed)
   Subjective:    Patient ID: Susan Liu, female    DOB: 1956-02-25, 60 y.o.   MRN: [id number]  Sinusitis  This is a new problem. Episode onset: 2 days. Associated symptoms include congestion, coughing and a sore throat. (Achy) Treatments tried: sinus pills.   Took sudafed prn  strated coughing, getting stuff up   Runny nose nasal cong  achey coughing  Off an on phlegm, throat dry    No oter cough meds    Non smokewr      Review of Systems  HENT: Positive for congestion and sore throat.   Respiratory: Positive for cough.        Objective:   Physical Exam Alert, mild malaise. Hydration good Vitals stable. frontal/ maxillary tenderness evident positive nasal congestion. pharynx normal neck supple  lungs clear/no crackles or wheezes. heart regular in rhythm        Assessment & Plan:  Impression rhinosinusitis likely post viral, discussed with patient. plan antibiotics prescribed. Questions answered. Symptomatic care discussed. warning signs discussed. WSL

## 2016-09-16 ENCOUNTER — Encounter: Payer: Self-pay | Admitting: Family Medicine

## 2016-09-19 ENCOUNTER — Encounter: Payer: Self-pay | Admitting: Family Medicine

## 2016-09-19 ENCOUNTER — Other Ambulatory Visit: Payer: Self-pay

## 2016-09-19 DIAGNOSIS — M25559 Pain in unspecified hip: Secondary | ICD-10-CM

## 2016-09-19 DIAGNOSIS — M79606 Pain in leg, unspecified: Secondary | ICD-10-CM

## 2016-09-19 NOTE — Telephone Encounter (Signed)
Our staff will be working on referral to Dr. Ruthe Mannan

## 2016-09-27 ENCOUNTER — Other Ambulatory Visit: Payer: Self-pay | Admitting: *Deleted

## 2016-09-27 ENCOUNTER — Telehealth: Payer: Self-pay | Admitting: Family Medicine

## 2016-09-27 ENCOUNTER — Encounter: Payer: Self-pay | Admitting: Family Medicine

## 2016-09-27 MED ORDER — AMOXICILLIN 500 MG PO CAPS: 500.0000 mg | ORAL_CAPSULE | Freq: Three times a day (TID) | ORAL | 0 refills | Status: DC

## 2016-09-27 NOTE — Telephone Encounter (Signed)
Throat feels raw, had to sleep sitting up last night due to sinus drainage, no fever, no trouble breathing.

## 2016-09-27 NOTE — Telephone Encounter (Signed)
Amoxil 500 mg 1 3 times a day for 10 days if ongoing troubles notify us-may need follow-up office visit if worsening issue

## 2016-09-27 NOTE — Telephone Encounter (Signed)
Patient seen on 08/26/16 for rhinosinusitis.  She said that cleared up, but now she is back having sore throat and drainage.  She doesn't believe she has a fever.  Can we call her something else in?   [place]

## 2016-09-27 NOTE — Telephone Encounter (Signed)
Discussed with pt. Med sent to pharm.  

## 2016-09-30 ENCOUNTER — Ambulatory Visit (INDEPENDENT_AMBULATORY_CARE_PROVIDER_SITE_OTHER): Payer: BLUE CROSS/BLUE SHIELD | Admitting: Family Medicine

## 2016-09-30 ENCOUNTER — Ambulatory Visit: Payer: BLUE CROSS/BLUE SHIELD | Admitting: Family Medicine

## 2016-09-30 ENCOUNTER — Encounter: Payer: Self-pay | Admitting: Family Medicine

## 2016-09-30 ENCOUNTER — Telehealth: Payer: Self-pay | Admitting: Family Medicine

## 2016-09-30 VITALS — Temp 98.1°F | Ht 60.0 in | Wt 124.2 lb

## 2016-09-30 DIAGNOSIS — B349 Viral infection, unspecified: Secondary | ICD-10-CM

## 2016-09-30 DIAGNOSIS — B9689 Other specified bacterial agents as the cause of diseases classified elsewhere: Secondary | ICD-10-CM | POA: Diagnosis not present

## 2016-09-30 DIAGNOSIS — J019 Acute sinusitis, unspecified: Secondary | ICD-10-CM | POA: Diagnosis not present

## 2016-09-30 MED ORDER — CEFDINIR 300 MG PO CAPS: 300.0000 mg | ORAL_CAPSULE | Freq: Two times a day (BID) | ORAL | 0 refills | Status: DC

## 2016-09-30 NOTE — Progress Notes (Signed)
   Subjective:    Patient ID: PIERA BALAM, female    DOB: [birth year], 60 y.o.   MRN: [id number]  Cough  This is a new problem. The current episode started in the past 7 days. Associated symptoms include headaches, nasal congestion and rhinorrhea. Pertinent negatives include no chest pain, ear pain, fever, shortness of breath or wheezing. Treatments tried: amoxil.   Patient had a sinus like illness at the end of September she cleared up middle September and then earlier this week started having head congestion drainage sore throat not feeling good she started on amoxicillin on Tuesday and Wednesday and Thursday felt a little better felt a lot worse today with fatigue tiredness severe sinus pressure and congestion   Review of Systems  Constitutional: Positive for fatigue. Negative for activity change and fever.  HENT: Positive for congestion and rhinorrhea. Negative for ear pain.   Eyes: Negative for discharge.  Respiratory: Positive for cough. Negative for shortness of breath and wheezing.   Cardiovascular: Negative for chest pain.  Neurological: Positive for headaches.       Objective:   Physical Exam  Constitutional: She appears well-developed.  HENT:  Head: Normocephalic.  Nose: Nose normal.  Mouth/Throat: Oropharynx is clear and moist. No oropharyngeal exudate.  Neck: Neck supple.  Cardiovascular: Normal rate and normal heart sounds.   No murmur heard. Pulmonary/Chest: Effort normal and breath sounds normal. She has no wheezes.  Lymphadenopathy:    She has no cervical adenopathy.  Skin: Skin is warm and dry.  Nursing note and vitals reviewed.         Assessment & Plan:  Viral URI Secondary rhinosinusitis Change antibiotics  The majority of her symptoms unfortunately are related into a progression of the viral illness with secondary sinusitis I find no evidence of meningitis or pneumonia warning signs discussed follow-up if problems

## 2016-09-30 NOTE — Telephone Encounter (Signed)
Pt called stating that the medication she was prescribed yesterday has not worked and is wanting to know what else she can do. Please advise.

## 2016-09-30 NOTE — Telephone Encounter (Signed)
Spoke with patient and patient has c/o congestion, cough, fever, and weakness. Please advise?

## 2016-09-30 NOTE — Telephone Encounter (Signed)
Patient transferred to front desk to schedule appointment for today per Dr. Nicki Reaper.

## 2016-10-03 ENCOUNTER — Other Ambulatory Visit: Payer: Self-pay | Admitting: Family Medicine

## 2016-10-03 MED ORDER — ALENDRONATE SODIUM 70 MG PO TABS: ORAL_TABLET | ORAL | 0 refills | Status: DC

## 2016-10-04 ENCOUNTER — Ambulatory Visit: Payer: BLUE CROSS/BLUE SHIELD | Admitting: Orthopedic Surgery

## 2016-10-05 ENCOUNTER — Encounter: Payer: Self-pay | Admitting: Family Medicine

## 2016-10-05 ENCOUNTER — Other Ambulatory Visit: Payer: Self-pay | Admitting: Family Medicine

## 2016-10-05 MED ORDER — ALENDRONATE SODIUM 70 MG PO TABS: ORAL_TABLET | ORAL | 0 refills | Status: DC

## 2016-10-13 ENCOUNTER — Encounter (INDEPENDENT_AMBULATORY_CARE_PROVIDER_SITE_OTHER): Payer: Self-pay | Admitting: Orthopaedic Surgery

## 2016-10-13 ENCOUNTER — Ambulatory Visit (INDEPENDENT_AMBULATORY_CARE_PROVIDER_SITE_OTHER): Payer: BLUE CROSS/BLUE SHIELD | Admitting: Orthopaedic Surgery

## 2016-10-13 VITALS — Ht 60.0 in | Wt 120.0 lb

## 2016-10-13 DIAGNOSIS — M545 Low back pain, unspecified: Secondary | ICD-10-CM

## 2016-10-13 DIAGNOSIS — Q062 Diastematomyelia: Secondary | ICD-10-CM | POA: Diagnosis not present

## 2016-10-13 NOTE — Progress Notes (Signed)
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 Bilateral low back pain without sciatica, unspecified chronicity     Plan: I will continue to work on a walking program stretching program. Therapy is an option for treatment the of her IT band symptoms. No evidence for radiculopathy. Knee exam of right knee is normal. She can return if the therapy exercises do not improve her symptoms.  Follow-Up Instructions: Return if symptoms worsen or fail to improve.   Orders:  No orders of the defined types were placed in this encounter.  No orders of the defined types were placed in this encounter.     Procedures: No procedures performed   Clinical Data: No additional findings.   Subjective: Chief Complaint  Patient presents with  . Right Thigh - Pain  . Right Knee - Pain    Patient returns with pain in right thigh. The pain is in her right hip, right thigh, and right knee. She was seen in September and it was felt that her symptoms could be coming from her back. Since that visit, she has seen her neurosurgeon and had a MRI of her back and that showed no problems. She has had a chiropractor measure her leg lengths, and her right leg is shorter. She states it is more of a "heaviness" around distal thigh and knee. Even doing a 30 minute walk bothers it. She is having difficulty sleeping on her side due to throbbing pain. She currently takes Mobic and Tylenol.      Review of Systems  Constitutional: Positive for fatigue. Negative for chills and diaphoresis.  HENT: Negative for ear discharge, ear pain and nosebleeds.   Eyes: Negative for discharge and visual disturbance.  Respiratory: Negative for cough, choking and  shortness of breath.   Cardiovascular: Negative for chest pain and palpitations.  Gastrointestinal: Negative for abdominal distention and abdominal pain.  Endocrine: Negative for cold intolerance and heat intolerance.  Genitourinary: Negative for flank pain and hematuria.  Musculoskeletal: Positive for arthralgias, back pain and neck pain.  Skin: Negative for rash and wound.  Neurological: Negative for seizures and speech difficulty.  Hematological: Negative for adenopathy. Does not bruise/bleed easily.  Psychiatric/Behavioral: Negative for agitation and suicidal ideas.     Objective: Vital Signs: Ht 5' (1.524 m)   Wt 120 lb (54.4 kg)   BMI 23.44 kg/m   Physical Exam  Constitutional: She is oriented to person, place, and time. She appears well-developed.  HENT:  Head: Normocephalic.  Right Ear: External ear normal.  Left Ear: External ear normal.  Eyes: Pupils are equal, round, and reactive to light.  Neck: No tracheal deviation present. No thyromegaly present.  Cardiovascular: Normal rate.   Pulmonary/Chest: Effort normal.  Abdominal: Soft.  Musculoskeletal:  Patient complains of crepitus with neck range of motion. Upper extremity reflexes are 2+. She has negative Corky Sox test on the left. Tenderness over the iliotibial band and tenderness it radiates up the trochanter. Ligamentous exam of her knee right and left is normal. Knee and ankle jerk are intact. The popliteal compression  test good capillary refill distal pulses are 2+. No clonus no hyperreflexia  Neurological: She is alert and oriented to person, place, and time.  Skin: Skin is warm and dry.  Psychiatric: She has a normal mood and affect. Her behavior is normal.    Ortho Exam normal heel-to-toe gait. Some tenderness over the iliotibial band distally at the lateral femoral condyle where she describes a sensation of being tight or uncomfortable or warm. No isolated motor weakness lower extremity. No lower extremity  clonus.  Specialty Comments:  No specialty comments available.  Imaging: No results found.   PMFS History: Patient Active Problem List   Diagnosis Date Noted  . Osteoporosis 06/17/2016  . Atypical pigmented skin lesion 05/21/2015  . 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 Spasm of muscle 07/29/2014  . Stiffness of joints, not elsewhere classified, multiple sites 07/29/2014  . Osteopenia 06/05/2014  . Cervical nerve root compression 06/05/2014  . Osteoporosis, unspecified 05/24/2013  . Hyperlipemia 05/24/2013  . Migraines 05/03/2013   Past Medical History:  Diagnosis Date  . Heart murmur 1993  . Migraine   . Osteoporosis     Family History  Problem Relation Age of Onset  . Cancer Mother     Lung  . Heart attack Father     Past Surgical History:  Procedure Laterality Date  . CESAREAN SECTION     x2  . COLONOSCOPY     Social History   Occupational History  . Not on file.   Social History Main Topics  . Smoking status: Never Smoker  . Smokeless tobacco: Never Used  . Alcohol use Not on file  . Drug use: Unknown  . Sexual activity: Not on file

## 2016-11-02 ENCOUNTER — Other Ambulatory Visit: Payer: Self-pay | Admitting: *Deleted

## 2016-11-02 ENCOUNTER — Ambulatory Visit (HOSPITAL_COMMUNITY): Payer: BLUE CROSS/BLUE SHIELD | Attending: Orthopaedic Surgery | Admitting: Physical Therapy

## 2016-11-02 DIAGNOSIS — M25551 Pain in right hip: Secondary | ICD-10-CM | POA: Diagnosis present

## 2016-11-02 DIAGNOSIS — R262 Difficulty in walking, not elsewhere classified: Secondary | ICD-10-CM | POA: Insufficient documentation

## 2016-11-02 MED ORDER — SUMATRIPTAN SUCCINATE 100 MG PO TABS: ORAL_TABLET | ORAL | 0 refills | Status: DC

## 2016-11-02 NOTE — Patient Instructions (Addendum)
Stretching: Hamstring (Supine)    Supporting right thigh behind knee, slowly straighten knee until stretch is felt in back of thigh. Hold __30__ seconds. Repeat __3__ times per set. Do __1__ sets per session. Do ___2_ sessions per day.  http://orth.exer.us/656   Copyright  VHI. All rights reserved.  Strengthening: Hip Abduction (Side-Lying)    Tighten muscles on front of left thigh, then lift leg __10__ inches from surface, keeping knee locked.  Repeat _10___ times per set. Do __1__ sets per session. Do _2___ sessions per day.  http://orth.exer.us/622   Copyright  VHI. All rights reserved.  Piriformis Stretch (All-Fours)    With right leg crossed in front, slide other leg back, lowering hips until stretch is felt. Repeat _2___ times per set. Do ___1_ sets per session. Do ___2_ sessions per day. Hold for 60 seconds   http://orth.exer.us/292   Copyright  VHI. All rights reserved.  Ice 3-4 times a day for 2-4 minutes

## 2016-11-02 NOTE — Therapy (Signed)
[facility] [place], [place], [address] Phone: [phone]   Fax:  [phone]  Physical Therapy Evaluation  Patient Details  Name: Susan Liu MRN: [id number] Date of Birth: 11-01-1956 Referring Provider: Rodell Perna   Encounter Date: 11/02/2016      PT End of Session - 11/02/16 1558    Visit Number 1   Number of Visits 4   Date for PT Re-Evaluation 12/02/16   Authorization Type [employer]   Authorization - Visit Number 1   Authorization - Number of Visits 2   PT Start Time [id number]   PT Stop Time [id number]   PT Time Calculation (min) 42 min   Equipment Utilized During Treatment Gait belt   Activity Tolerance Patient tolerated treatment well      Past Medical History:  Diagnosis Date  . Heart murmur 1993  . Migraine   . Osteoporosis     Past Surgical History:  Procedure Laterality Date  . CESAREAN SECTION     x2  . COLONOSCOPY      There were no vitals filed for this visit.       Subjective Assessment - 11/02/16 1519    Subjective Ms. Griffieth states that she has been having pain going down the outside of her right leg to her knee for about six months. The pain does seem to be getting better.  There has been no trauma or injury.  She has tried medication but it is not helping.  Her MD has now referred her to physcial therapy      Pertinent History osteoporosis,   How long can you sit comfortably? no problem    How long can you stand comfortably? back begins to bother her    How long can you walk comfortably? after 20 minutes she begins to have pain in her right knee.    Diagnostic tests MRI (-)    Patient Stated Goals walk longer, sleep better.  (Pt needs to sleep on her left side with two pillows between her legs)    Currently in Pain? --  pain will get as high as a 7/10             [facility] PT Assessment - 11/02/16 0001      Assessment   Medical Diagnosis IT syndrome   Referring Provider Rodell Perna    Onset  Date/Surgical Date 05/03/16   Next MD Visit 12/12/2016   Prior Therapy not for this problem      Restrictions   Weight Bearing Restrictions No     Balance Screen   Has the patient fallen in the past 6 months No   Has the patient had a decrease in activity level because of a fear of falling?  No   Is the patient reluctant to leave their home because of a fear of falling?  No     Home Ecologist residence     Prior Function   Level of Independence Independent   Vocation Full time employment   Vocation Requirements sit and stand no lifting    Leisure exercise      Cognition   Overall Cognitive Status Within Functional Limits for tasks assessed     Observation/Other Assessments   Focus on Therapeutic Outcomes (FOTO)  46     Functional Tests   Functional tests Single leg stance     Single Leg Stance   Comments Lt: 60: RT 60  ROM / Strength   AROM / PROM / Strength AROM;Strength     Strength   Strength Assessment Site Hip;Knee;Ankle   Right/Left Hip Right;Left   Right Hip Flexion 5/5   Right Hip Extension 5/5   Right Hip ABduction 3+/5   Left Hip Flexion 5/5   Left Hip Extension 5/5   Left Hip ABduction 4/5   Right/Left Knee Right;Left   Right Knee Flexion 5/5   Right Knee Extension 5/5   Left Knee Flexion 5/5   Left Knee Extension 5/5   Right/Left Ankle Right;Left   Right Ankle Dorsiflexion 5/5   Left Ankle Dorsiflexion 5/5     Flexibility   Soft Tissue Assessment /Muscle Length yes   Hamstrings Lt 150 :Rt 160   Piriformis Rt 55; Lt 50                   [facility] Adult PT Treatment/Exercise - 11/02/16 0001      Exercises   Exercises Knee/Hip     Knee/Hip Exercises: Stretches   Active Hamstring Stretch Both;2 reps;30 seconds     Knee/Hip Exercises: Sidelying   Hip ABduction Strengthening;Right;10 reps     Knee/Hip Exercises: Prone   Other Prone Exercises PIriformis stretch x 60"                PT  Education - 11/02/16 1558    Education provided Yes   Education Details HEP as well as Games developer) Educated Patient   Methods Explanation   Comprehension Verbalized understanding;Returned demonstration          PT Short Term Goals - 11/02/16 1604      PT SHORT TERM GOAL #1   Title Pt to state that the pain going down the lateral aspect of her right leg is no greater than a 4/10 to allow pt to be able to walk for 40 minutes without pain    Time 2   Period Weeks   Status New     PT SHORT TERM GOAL #2   Title Pt to state that she is able to sleep with only one pillow between her leg while left sidelying    Time 2   Period Weeks   Status New           PT Long Term Goals - 11/02/16 1606      PT LONG TERM GOAL #1   Title Pt to state that her Rt hip pain is no greater than a 2/10 to allow pt to be walking for over an hour without increase hip pain.   Time 4   Period Weeks   Status New     PT LONG TERM GOAL #2   Title Pt to be able to lie on her right hip without increased pain.   Time 4   Period Weeks   Status New               Plan - 11/02/16 1559    Clinical Impression Statement Ms Goulden is a 60 yo female who has been having pain going down the lateral aspect of her right leg with weight bearing for the past six months.  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  She has been referred to skilled physical therapy; examination demonstrates tight hamstrings Bilaterally, tight piriformis mm on the right and weakened hip abductors  bilaterally with the right being greater than the left.  Ms. Souffrant will benefit from skilled physical therapy to address these issues to maximize her functional level.      Rehab Potential Good   PT Frequency 1x / week   PT Duration 4 weeks   PT Treatment/Interventions Patient/family education;Manual techniques;Therapeutic  exercise;Therapeutic activities   PT Next Visit Plan Check SI, begin manual on IT band as needed assess how home exercise program is doing and add ITB stretch to HEP    PT Home Exercise Plan Hamstring and piriformis stretch; hip abduction       Patient will benefit from skilled therapeutic intervention in order to improve the following deficits and impairments:  Decreased activity tolerance, Decreased mobility, Decreased strength, Difficulty walking, Pain  Visit Diagnosis: Pain in right hip - Plan: PT plan of care cert/re-cert  Difficulty in walking, not elsewhere classified - Plan: PT plan of care cert/re-cert     Problem List Patient Active Problem List   Diagnosis Date Noted  . Osteoporosis 06/17/2016  . Atypical pigmented skin lesion 05/21/2015  . 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 Spasm of muscle 07/29/2014  . Stiffness of joints, not elsewhere classified, multiple sites 07/29/2014  . Osteopenia 06/05/2014  . Cervical nerve root compression 06/05/2014  . Osteoporosis, unspecified 05/24/2013  . Hyperlipemia 05/24/2013  . Migraines 05/03/2013    Rayetta Humphrey, PT CLT [phone] 11/02/2016, 4:20 PM  [facility] [address] [place], [place], [address] Phone: [phone]   Fax:  [phone]  Name: DENELDA MARELLI MRN: [id number] Date of Birth: 01/22/1956

## 2016-11-03 ENCOUNTER — Telehealth (INDEPENDENT_AMBULATORY_CARE_PROVIDER_SITE_OTHER): Payer: Self-pay | Admitting: Radiology

## 2016-11-03 NOTE — Telephone Encounter (Signed)
Patient would like to know if her MRI results from [facility] have come in yet? Please call to advise.

## 2016-11-08 ENCOUNTER — Encounter: Payer: Self-pay | Admitting: Family Medicine

## 2016-11-09 ENCOUNTER — Ambulatory Visit (HOSPITAL_COMMUNITY): Payer: BLUE CROSS/BLUE SHIELD

## 2016-11-09 ENCOUNTER — Encounter: Payer: Self-pay | Admitting: Family Medicine

## 2016-11-09 DIAGNOSIS — M25551 Pain in right hip: Secondary | ICD-10-CM | POA: Diagnosis not present

## 2016-11-09 DIAGNOSIS — R262 Difficulty in walking, not elsewhere classified: Secondary | ICD-10-CM

## 2016-11-09 NOTE — Therapy (Signed)
[place], [place], [address] Phone: [phone]   Fax:  [phone]  Physical Therapy Treatment  Patient Details  Name: Susan Liu MRN: [id number] Date of Birth: 31-Jul-1956 Referring Provider: Rodell Perna   Encounter Date: 11/09/2016      PT End of Session - 11/09/16 1607    Visit Number 2   Number of Visits 4   Date for PT Re-Evaluation 12/02/16   Authorization Type BCBS   Authorization - Visit Number 2   Authorization - Number of Visits 2   PT Start Time 3382   PT Stop Time 5053   PT Time Calculation (min) 42 min   Activity Tolerance Patient tolerated treatment well;No increased pain   Behavior During Therapy WFL for tasks assessed/performed      Past Medical History:  Diagnosis Date  . Heart murmur 1993  . Migraine   . Osteoporosis     Past Surgical History:  Procedure Laterality Date  . CESAREAN SECTION     x2  . COLONOSCOPY      There were no vitals filed for this visit.      Subjective Assessment - 11/09/16 1558    Subjective Pt reports her lateral aspect of Rt thing pain scale 5/10.  Reports improvements with abilty to sleep on Lt side.  Reports complaince iwth HEP daily with minimal difficulty   Pertinent History osteoporosis,   Currently in Pain? Yes   Pain Score 5    Pain Location Leg   Pain Orientation Right;Proximal;Lateral   Pain Descriptors / Indicators Aching   Pain Onset More than a month ago   Pain Frequency Intermittent   Aggravating Factors  to laying Rt side, crossing legs   Pain Relieving Factors pain medicaiton   Effect of Pain on Daily Activities unable to walk for greater than 20 minutes               [facility] Adult PT Treatment/Exercise - 11/09/16 0001      Knee/Hip Exercises: Stretches   Active Hamstring Stretch Both;3 reps;30 seconds   Active Hamstring Stretch Limitations supine with rope   Other Knee/Hip Stretches Supine ITB st 3x 30"      Knee/Hip Exercises: Sidelying   Hip ABduction Right;15 reps     Knee/Hip Exercises: Prone   Other Prone Exercises PIriformis stretch x 60"     Manual Therapy   Manual Therapy Other (comment)   Manual therapy comments Manual technique complete separate rest of tx   Other Manual Therapy Assessed SI alignment:  Rt anterior rotation, LLD Lt> Rt; no MET complete due to LLD and no reoprts of LBP                PT Education - 11/09/16 1832    Education provided Yes   Education Details Reviewed goals, compliance iwth HEP, copy of eval given to pt.  Pt educated on benefits of adding heel lift for Rt LE.   Person(s) Educated Patient   Methods Explanation;Demonstration;Handout   Comprehension Verbalized understanding;Returned demonstration;Need further instruction          PT Short Term Goals - 11/02/16 1604      PT SHORT TERM GOAL #1   Title Pt to state that the pain going down the lateral aspect of her right leg is no greater than a 4/10 to allow pt to be able to walk for 40 minutes without pain    Time 2   Period  Weeks   Status New     PT SHORT TERM GOAL #2   Title Pt to state that she is able to sleep with only one pillow between her leg while left sidelying    Time 2   Period Weeks   Status New           PT Long Term Goals - 11/02/16 1606      PT LONG TERM GOAL #1   Title Pt to state that her Rt hip pain is no greater than a 2/10 to allow pt to be walking for over an hour without increase hip pain.   Time 4   Period Weeks   Status New     PT LONG TERM GOAL #2   Title Pt to be able to lie on her right hip without increased pain.   Time 4   Period Weeks   Status New               Plan - 11/09/16 1619    Clinical Impression Statement Reviewed goals, assured compliance and correct form and technqiue with HEP and copy of eval given to pt.  Began session checking SI alignment with the following findings:  Noted Rt SI anterior rotation with significant  leg length disrespeancy Lt>Rt though no reports of pain with SI joint.  Did not complete muscle energy technique as pt does have scoliosis though no reports of lower back pain.  Educated pt on benefits of getting heel support in shoes to improve leg length for gait mechanics.  Added ITB stretch in standing and supine with additional HEP given.  EOS pt reports pain reduced to 3/10.     Rehab Potential Good   PT Frequency 1x / week   PT Duration 4 weeks   PT Treatment/Interventions Patient/family education;Manual techniques;Therapeutic exercise;Therapeutic activities   PT Next Visit Plan Check SI/heel support for Rt LE for LLD, begin manual on IT band as needed assess how home exercise program is doing   PT Home Exercise Plan Hamstring and piriformis stretch; hip abduction; 12/13 added supine ITB strech to HEP      Patient will benefit from skilled therapeutic intervention in order to improve the following deficits and impairments:  Decreased activity tolerance, Decreased mobility, Decreased strength, Difficulty walking, Pain  Visit Diagnosis: Pain in right hip  Difficulty in walking, not elsewhere classified     Problem List Patient Active Problem List   Diagnosis Date Noted  . Osteoporosis 06/17/2016  . Atypical pigmented skin lesion 05/21/2015  . 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 Spasm of muscle 07/29/2014  . Stiffness of joints, not elsewhere classified, multiple sites 07/29/2014  . Osteopenia 06/05/2014  . Cervical nerve root compression 06/05/2014  . Osteoporosis, unspecified 05/24/2013  . Hyperlipemia 05/24/2013  . Migraines 05/03/2013   Ihor Austin, [place]; [place]  Aldona Lento 11/09/2016, 6:35 PM  [facility] [address] [place], [place], [address] Phone: [phone]   Fax:  [phone]  Name: Susan Liu MRN: [id number] Date of Birth: [birth year]

## 2016-11-09 NOTE — Patient Instructions (Signed)
Outer Hip Stretch: Reclined IT Band Stretch (Strap)    Strap around opposite foot, pull across only as far as possible with shoulders on mat. Hold for 30 breaths. Repeat 3 times each leg.  Copyright  VHI. All rights reserved.   

## 2016-11-11 NOTE — Telephone Encounter (Signed)
Do you see this on Betsy's desk?

## 2016-11-14 NOTE — Telephone Encounter (Signed)
Susan Liu I checked on your desk for this patient's MRI results, but I didn't see anything.

## 2016-11-15 ENCOUNTER — Telehealth (HOSPITAL_COMMUNITY): Payer: Self-pay | Admitting: Family Medicine

## 2016-11-15 NOTE — Telephone Encounter (Signed)
Patient is having some back painand want to relax

## 2016-11-16 ENCOUNTER — Ambulatory Visit (HOSPITAL_COMMUNITY): Payer: BLUE CROSS/BLUE SHIELD | Admitting: Physical Therapy

## 2016-11-16 ENCOUNTER — Ambulatory Visit (HOSPITAL_COMMUNITY): Payer: BLUE CROSS/BLUE SHIELD

## 2016-11-22 ENCOUNTER — Ambulatory Visit (HOSPITAL_COMMUNITY): Payer: BLUE CROSS/BLUE SHIELD

## 2016-11-22 DIAGNOSIS — M25551 Pain in right hip: Secondary | ICD-10-CM

## 2016-11-22 DIAGNOSIS — R262 Difficulty in walking, not elsewhere classified: Secondary | ICD-10-CM

## 2016-11-22 NOTE — Therapy (Signed)
[place], [place], [address] Phone: [phone]   Fax:  [phone]  Physical Therapy Treatment  Patient Details  Name: Susan Liu MRN: [id number] Date of Birth: 1955-12-17 Referring Provider: Rodell Perna   Encounter Date: 11/22/2016      PT End of Session - 11/22/16 1356    Visit Number 3   Number of Visits 4   Date for PT Re-Evaluation 12/02/16   Authorization Type BCBS   Authorization - Visit Number 3   PT Start Time [id number]   PT Stop Time 1430   PT Time Calculation (min) 42 min   Activity Tolerance Patient tolerated treatment well;No increased pain   Behavior During Therapy WFL for tasks assessed/performed      Past Medical History:  Diagnosis Date  . Heart murmur 1993  . Migraine   . Osteoporosis     Past Surgical History:  Procedure Laterality Date  . CESAREAN SECTION     x2  . COLONOSCOPY      There were no vitals filed for this visit.      Subjective Assessment - 11/22/16 1349    Subjective Pt stated both her knees are very achey, reports ability to ambulate for 20 minutes prior need to sit down.  Reports improved sleeping though stilll unable to lay on Rt side   Pertinent History osteoporosis,   Patient Stated Goals walk longer, sleep better.  (Pt needs to sleep on her left side with two pillows between her legs)    Currently in Pain? Yes   Pain Score 7    Pain Location Knee   Pain Orientation Right;Lateral   Pain Descriptors / Indicators Aching   Pain Onset More than a month ago   Pain Frequency Intermittent   Aggravating Factors  to laying on Rt side, crossings legs   Pain Relieving Factors pain medication   Effect of Pain on Daily Activities unable to walk greater than 20 minutes               [facility] Adult PT Treatment/Exercise - 11/22/16 0001      Knee/Hip Exercises: Stretches   Active Hamstring Stretch Both;3 reps;30 seconds   Active Hamstring Stretch Limitations  supine with rope   Other Knee/Hip Stretches Supine ITB st 3x 30"     Knee/Hip Exercises: Sidelying   Hip ABduction Right;15 reps   Hip ABduction Limitations Cueing for correct form     Knee/Hip Exercises: Prone   Other Prone Exercises PIriformis stretch 2 x 60"     Manual Therapy   Manual Therapy Soft tissue mobilization;Joint mobilization   Manual therapy comments Manual technique complete separate rest of tx   Joint Mobilization patella mobs, tib/fib   Soft tissue mobilization ITB in supine with STM and rolling pin                  PT Short Term Goals - 11/02/16 1604      PT SHORT TERM GOAL #1   Title Pt to state that the pain going down the lateral aspect of her right leg is no greater than a 4/10 to allow pt to be able to walk for 40 minutes without pain    Time 2   Period Weeks   Status New     PT SHORT TERM GOAL #2   Title Pt to state that she is able to sleep with only one pillow between her leg while left sidelying  Time 2   Period Weeks   Status New           PT Long Term Goals - 11/02/16 1606      PT LONG TERM GOAL #1   Title Pt to state that her Rt hip pain is no greater than a 2/10 to allow pt to be walking for over an hour without increase hip pain.   Time 4   Period Weeks   Status New     PT LONG TERM GOAL #2   Title Pt to be able to lie on her right hip without increased pain.   Time 4   Period Weeks   Status New               Plan - 11/22/16 1419    Clinical Impression Statement Pt reports compliance wiht HEP daily and has been applying ice on regular basis with reoprts of improved sleep quality and for longer periods of time.  Pt does continue to have high pain especially with gait or standing.  Upon arrival therapist did notice increased edema to medial aspect of knee and tight ITB upon palpation.  Began session with manual retro massage for LE and STM to ITB with vast improvements with edema in medial knee with reports of  pain reduced.  Continued with stretches for pain control and glut med strengthening.  Cueing and education for proper mm activation with sidelying exercise for maximal benefits.  Pt stated she has purchased heel support for Rt foot though has only worn for a couple days and stopped due to minimal changes noted, pt educated on benefits of continuing to wear heel support with pain to improve SI alignment wiht LLD.  Reports pain reduced at EOS.   Rehab Potential Good   PT Frequency 1x / week   PT Duration 4 weeks   PT Treatment/Interventions Patient/family education;Manual techniques;Therapeutic exercise;Therapeutic activities   PT Next Visit Plan Check SI/heel support for Rt LE for LLD, begin manual on IT band as needed assess how home exercise program is doing   PT Home Exercise Plan Hamstring and piriformis stretch; hip abduction; 12/13 added supine ITB strech to HEP      Patient will benefit from skilled therapeutic intervention in order to improve the following deficits and impairments:  Decreased activity tolerance, Decreased mobility, Decreased strength, Difficulty walking, Pain  Visit Diagnosis: Pain in right hip  Difficulty in walking, not elsewhere classified     Problem List Patient Active Problem List   Diagnosis Date Noted  . Osteoporosis 06/17/2016  . Atypical pigmented skin lesion 05/21/2015  . 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 Spasm of muscle 07/29/2014  . Stiffness of joints, not elsewhere classified, multiple sites 07/29/2014  . Osteopenia 06/05/2014  . Cervical nerve root compression 06/05/2014  . Osteoporosis, unspecified 05/24/2013  . Hyperlipemia 05/24/2013  . Migraines 05/03/2013   Ihor Austin, [place]; [place]  Aldona Lento 11/22/2016, 2:47 PM  [facility] [address] [place], [place], [address] Phone: [phone]   Fax:  [phone]  Name: Susan Liu MRN: [id number] Date of  Birth: 10/28/1956

## 2016-11-30 ENCOUNTER — Ambulatory Visit (HOSPITAL_COMMUNITY): Payer: BLUE CROSS/BLUE SHIELD | Attending: Orthopaedic Surgery | Admitting: Physical Therapy

## 2016-11-30 DIAGNOSIS — M25551 Pain in right hip: Secondary | ICD-10-CM

## 2016-11-30 DIAGNOSIS — R262 Difficulty in walking, not elsewhere classified: Secondary | ICD-10-CM | POA: Insufficient documentation

## 2016-11-30 NOTE — Therapy (Signed)
[facility] [place], [place], [address] Phone: [phone]   Fax:  [phone]  Physical Therapy Treatment  Patient Details  Name: Susan Liu MRN: [id number] Date of Birth: May 30, 1956 Referring Provider: Rodell Perna   Encounter Date: 11/30/2016      PT End of Session - 11/30/16 1714    Visit Number 4   Number of Visits 4   Date for PT Re-Evaluation 12/02/16   Authorization Type BCBS   Authorization - Visit Number 4   PT Start Time [id number]   PT Stop Time 1642   PT Time Calculation (min) 36 min   Activity Tolerance Patient tolerated treatment well;No increased pain   Behavior During Therapy WFL for tasks assessed/performed      Past Medical History:  Diagnosis Date  . Heart murmur 1993  . Migraine   . Osteoporosis     Past Surgical History:  Procedure Laterality Date  . CESAREAN SECTION     x2  . COLONOSCOPY      There were no vitals filed for this visit.      Subjective Assessment - 11/30/16 1710    Subjective Pt states she is really flaired up today in her hip down into her knee.  Pt reports no real lasting relief thus far.   Currently in Pain? Yes   Pain Score 8    Pain Location Knee   Pain Orientation Right;Lateral   Pain Descriptors / Indicators Aching                         [facility] Adult PT Treatment/Exercise - 11/30/16 0001      Knee/Hip Exercises: Stretches   Active Hamstring Stretch Both;3 reps;30 seconds   Active Hamstring Stretch Limitations supine with rope   Other Knee/Hip Stretches Supine ITB st 3x 30"     Manual Therapy   Manual Therapy Soft tissue mobilization;Joint mobilization   Manual therapy comments Manual technique complete separate rest of tx   Joint Mobilization patella mobs, tib/fib   Soft tissue mobilization ITB in supine with STM and rolling pin                  PT Short Term Goals - 11/02/16 1604      PT SHORT TERM GOAL #1   Title Pt to  state that the pain going down the lateral aspect of her right leg is no greater than a 4/10 to allow pt to be able to walk for 40 minutes without pain    Time 2   Period Weeks   Status New     PT SHORT TERM GOAL #2   Title Pt to state that she is able to sleep with only one pillow between her leg while left sidelying    Time 2   Period Weeks   Status New           PT Long Term Goals - 11/02/16 1606      PT LONG TERM GOAL #1   Title Pt to state that her Rt hip pain is no greater than a 2/10 to allow pt to be walking for over an hour without increase hip pain.   Time 4   Period Weeks   Status New     PT LONG TERM GOAL #2   Title Pt to be able to lie on her right hip without increased pain.   Time 4   Period Weeks  Status New               Plan - 11/30/16 1715    Clinical Impression Statement Pt flaired up this session with noted frustration with lack of lasting relief.  Continued with focus on stretching and manual to area to decrese pain.  overall pain reduction at end of session verbalized, however still with pain and discomfort.  Pt intends of contacting MD for return appointment. To see evaluating therapist next session for re-evaluation.     Rehab Potential Good   PT Frequency 1x / week   PT Duration 4 weeks   PT Treatment/Interventions Patient/family education;Manual techniques;Therapeutic exercise;Therapeutic activities   PT Next Visit Plan re-evaluate next session   PT Home Exercise Plan Hamstring and piriformis stretch; hip abduction; 12/13 added supine ITB strech to HEP      Patient will benefit from skilled therapeutic intervention in order to improve the following deficits and impairments:  Decreased activity tolerance, Decreased mobility, Decreased strength, Difficulty walking, Pain  Visit Diagnosis: Pain in right hip  Difficulty in walking, not elsewhere classified     Problem List Patient Active Problem List   Diagnosis Date Noted  .  Osteoporosis 06/17/2016  . Atypical pigmented skin lesion 05/21/2015  . 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 Spasm of muscle 07/29/2014  . Stiffness of joints, not elsewhere classified, multiple sites 07/29/2014  . Osteopenia 06/05/2014  . Cervical nerve root compression 06/05/2014  . Osteoporosis, unspecified 05/24/2013  . Hyperlipemia 05/24/2013  . Migraines 05/03/2013    Teena Irani, PTA/CLT [phone]  11/30/2016, 5:25 PM  [facility] [address] [place], [place], [address] Phone: [phone]   Fax:  [phone]  Name: Susan Liu MRN: [id number] Date of Birth: 1956/10/28

## 2016-12-07 ENCOUNTER — Ambulatory Visit (HOSPITAL_COMMUNITY): Payer: BLUE CROSS/BLUE SHIELD | Admitting: Physical Therapy

## 2016-12-07 DIAGNOSIS — M25551 Pain in right hip: Secondary | ICD-10-CM

## 2016-12-07 DIAGNOSIS — R262 Difficulty in walking, not elsewhere classified: Secondary | ICD-10-CM

## 2016-12-07 NOTE — Therapy (Signed)
[place], [place], [address] Phone: [phone]   Fax:  [phone]  Physical Therapy Treatment  Patient Details  Name: Susan Liu MRN: [id number] Date of Birth: 1956/08/04 Referring Provider: Rodell Perna  Encounter Date: 12/07/2016      PT End of Session - 12/07/16 1614    Visit Number 5   Number of Visits 5   Date for PT Re-Evaluation 12/02/16   Authorization Type BCBS   Authorization - Visit Number 5   Authorization - Number of Visits 5   PT Start Time 4818   PT Stop Time 5631   PT Time Calculation (min) 43 min   Activity Tolerance Patient tolerated treatment well;No increased pain   Behavior During Therapy WFL for tasks assessed/performed      Past Medical History:  Diagnosis Date  . Heart murmur 1993  . Migraine   . Osteoporosis     Past Surgical History:  Procedure Laterality Date  . CESAREAN SECTION     x2  . COLONOSCOPY      There were no vitals filed for this visit.      Subjective Assessment - 12/07/16 1529    Subjective Pt states that she is just hurting at her knee.  States that the pain has not improved much.    Pertinent History osteoporosis,   Patient Stated Goals walk longer, sleep better.  (Pt needs to sleep on her left side with two pillows between her legs)    Currently in Pain? Yes   Pain Score 7    Pain Location Knee   Pain Descriptors / Indicators Aching   Pain Onset More than a month ago            [facility] PT Assessment - 12/07/16 0001      Assessment   Medical Diagnosis IT syndrome   Referring Provider Rodell Perna   Onset Date/Surgical Date 05/03/16   Next MD Visit 12/12/2016   Prior Therapy not for this problem      Restrictions   Weight Bearing Restrictions No     Balance Screen   Has the patient fallen in the past 6 months No   Has the patient had a decrease in activity level because of a fear of falling?  No   Is the patient reluctant to leave  their home because of a fear of falling?  No     Home [employer] residence     Prior Function   Level of Independence Independent   Vocation Full time employment   Vocation Requirements sit and stand no lifting    Leisure exercise      Cognition   Overall Cognitive Status Within Functional Limits for tasks assessed     Observation/Other Assessments   Focus on Therapeutic Outcomes (FOTO)  --     Functional Tests   Functional tests Single leg stance     Single Leg Stance   Comments Lt: 60: RT 60      Strength   Right Hip Flexion 5/5   Right Hip Extension 5/5   Right Hip ABduction 4+/5  was 3+/5    Left Hip Flexion 5/5   Left Hip Extension 5/5   Left Hip ABduction 4/5   Right Knee Flexion 5/5   Right Knee Extension 5/5   Left Knee Flexion 5/5   Left Knee Extension 5/5   Right Ankle Dorsiflexion 5/5   Left Ankle Dorsiflexion  5/5     Flexibility   Soft Tissue Assessment /Muscle Length yes   Hamstrings Lt 150 :Rt 160   Piriformis Rt 65; Lt 28                     [facility] Adult PT Treatment/Exercise - 12/07/16 0001      Exercises   Exercises Knee/Hip     Knee/Hip Exercises: Stretches   Active Hamstring Stretch Right;2 reps;30 seconds   Hip Flexor Stretch Right;3 reps;30 seconds   ITB Stretch Right;3 reps;30 seconds     Knee/Hip Exercises: Sidelying   Hip ABduction Strengthening;Right   Hip ABduction Limitations 5#      Modalities   Modalities Cryotherapy  Laser chronic tendon continus60J/cm2 1:24 x 2      Cryotherapy   Number Minutes Cryotherapy 3 Minutes   Cryotherapy Location Knee  lateral aspect insertion of IT band    Type of Cryotherapy Ice massage     Manual Therapy   Manual Therapy Soft tissue mobilization;Joint mobilization   Manual therapy comments Manual technique complete separate rest of tx   Soft tissue mobilization ITB in supine with STM and rolling pin                PT Education -  12/07/16 1612    Education provided Yes   Education Details begin weights for hip abduction; begin ice massage at insertion of IT band and begin standing IT band stretch   Person(s) Educated Patient   Methods Explanation   Comprehension Verbalized understanding;Returned demonstration          PT Short Term Goals - 12/07/16 1617      PT SHORT TERM GOAL #1   Title Pt to state that the pain going down the lateral aspect of her right leg is no greater than a 4/10 to allow pt to be able to walk for 40 minutes without pain    Time 2   Period Weeks   Status Not Met     PT SHORT TERM GOAL #2   Title Pt to state that she is able to sleep with only one pillow between her leg while left sidelying    Time 2   Period Weeks   Status Not Met           PT Long Term Goals - 12/07/16 1617      PT LONG TERM GOAL #1   Title Pt to state that her Rt hip pain is no greater than a 2/10 to allow pt to be walking for over an hour without increase hip pain.   Time 4   Period Weeks   Status Not Met     PT LONG TERM GOAL #2   Title Pt to be able to lie on her right hip without increased pain.   Time 4   Period Weeks   Status Not Met               Plan - 12/07/16 1614    Clinical Impression Statement Pt frustrated with lack of progress.  Pt given new stretches, urged to complete ice massage on insertion of the ITband.  PT has noted spider veins where she states that she is having significant pain; Pt counseled that compression garments may help her pain.  Pt is independent with all activity and would like to be discharged to home exercise program.    Rehab Potential Good   PT Frequency 1x / week  PT Duration 4 weeks   PT Treatment/Interventions Patient/family education;Manual techniques;Therapeutic exercise;Therapeutic activities   PT Next Visit Plan discharge to home program.  Pt to follow up with MD    PT Home Exercise Plan Hamstring and piriformis stretch; hip abduction; 12/13 added  supine ITB strech to HEP      Patient will benefit from skilled therapeutic intervention in order to improve the following deficits and impairments:  Decreased activity tolerance, Decreased mobility, Decreased strength, Difficulty walking, Pain  Visit Diagnosis: Pain in right hip  Difficulty in walking, not elsewhere classified     Problem List Patient Active Problem List   Diagnosis Date Noted  . Osteoporosis 06/17/2016  . Atypical pigmented skin lesion 05/21/2015  . 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 Spasm of muscle 07/29/2014  . Stiffness of joints, not elsewhere classified, multiple sites 07/29/2014  . Osteopenia 06/05/2014  . Cervical nerve root compression 06/05/2014  . Osteoporosis, unspecified 05/24/2013  . Hyperlipemia 05/24/2013  . Migraines 05/03/2013    Rayetta Humphrey, PT CLT [phone] 12/07/2016, 4:18 PM  [facility] [address] [place], [place], [address] Phone: [phone]   Fax:  [phone]  Name: Susan Liu MRN: [id number] Date of Birth: 1955-12-26 PHYSICAL THERAPY DISCHARGE SUMMARY  Visits from Start of Care: 5  Current functional level related to goals / functional outcomes: See above    Remaining deficits: See above   Education / Equipment: HEP Plan: Patient agrees to discharge.  Patient goals were not met. Patient is being discharged due to lack of progress.  ?????        Rayetta Humphrey, [place] CLT [phone]

## 2016-12-07 NOTE — Patient Instructions (Addendum)
Iliotibial Band Stretch, Standing    Stand, hands on hips, one leg crossed in front of other leg. Lean to same side as front leg until stretch is felt on other hip. Hold 30___ seconds. Change foot position and lean to same side. Hold _30__ seconds. Repeat _1__ times per session. Do _2__ sessions per day.  Copyright  VHI. All rights reserved.  Quads / HF, Supine    Lie near edge of bed, one leg bent, foot flat on bed. Other leg hanging over edge, relaxed, thigh resting entirely on bed. Bend hanging knee backward keeping thigh in contact with bed. Hold _20__ seconds.  Repeat _3__ times per session. Do 1___ sessions per day.  Copyright  VHI. All rights reserved.

## 2016-12-13 ENCOUNTER — Other Ambulatory Visit: Payer: Self-pay | Admitting: *Deleted

## 2016-12-13 MED ORDER — ALENDRONATE SODIUM 70 MG PO TABS: ORAL_TABLET | ORAL | 1 refills | Status: DC

## 2016-12-15 ENCOUNTER — Ambulatory Visit (INDEPENDENT_AMBULATORY_CARE_PROVIDER_SITE_OTHER): Payer: BLUE CROSS/BLUE SHIELD | Admitting: Orthopaedic Surgery

## 2016-12-22 ENCOUNTER — Encounter (INDEPENDENT_AMBULATORY_CARE_PROVIDER_SITE_OTHER): Payer: Self-pay | Admitting: Orthopaedic Surgery

## 2016-12-22 ENCOUNTER — Ambulatory Visit (INDEPENDENT_AMBULATORY_CARE_PROVIDER_SITE_OTHER): Payer: BLUE CROSS/BLUE SHIELD | Admitting: Orthopaedic Surgery

## 2016-12-22 VITALS — BP 138/86 | HR 85 | Ht 60.0 in | Wt 120.0 lb

## 2016-12-22 DIAGNOSIS — M7631 Iliotibial band syndrome, right leg: Secondary | ICD-10-CM

## 2016-12-22 NOTE — Progress Notes (Signed)
Office Visit Note   Patient: Susan Liu           Date of Birth: December 02, 1955           MRN: [id number] Visit Date: 12/22/2016              Requested by: Kathyrn Drown, MD [place], [place] [address] PCP: Sallee Lange, MD   Assessment & Plan: Visit Diagnoses:  1. Iliotibial band syndrome of right side     Plan: Injection performed right iliotibial band. Improvement postinjection. She'll return if she has continued symptoms.  Follow-Up Instructions: Return if symptoms worsen or fail to improve.   Orders:  Orders Placed This Encounter  Procedures  . Large Joint Injection/Arthrocentesis   No orders of the defined types were placed in this encounter.     Procedures: Large Joint Inj Date/Time: 12/22/2016 3:50 PM Performed by: Marybelle Killings Authorized by: Rodell Perna C   Consent Given by:  Patient Indications:  Pain and joint swelling Location:  Knee Site:  R knee Needle Size:  22 G Needle Length:  1.5 inches Approach:  Anterolateral Ultrasound Guidance: No   Fluoroscopic Guidance: No   Arthrogram: No   Medications:  1 mL lidocaine 1 %; 40 mg methylPREDNISolone acetate 40 MG/ML; 0.66 mL bupivacaine 0.25 % Aspiration Attempted: No   Patient tolerance:  Patient tolerated the procedure well with no immediate complications     Clinical Data: No additional findings.   Subjective: Chief Complaint  Patient presents with  . Right Knee - Pain    Patient returns for follow up right knee pain.  She states that physical therapy did not help at all. She continues to have pain after 20 min of walking.  She would like to try an injection today.   Patient went to physical therapy doesn't feel like it really helped in some ways therapy made it worse she has increased pain over the lateral femoral condyle right knee where the iliotibial band is in contact. She denies any associated back pain. She's had cervical epidurals in the distant past that did  great.  Review of Systems 14 polys systems updated from 11/03/2016 and is unchanged other than as mentioned above. 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 She likes to swim.   Objective: Vital Signs: BP 138/86   Pulse 85   Ht 5' (1.524 m)   Wt 120 lb (54.4 kg)   BMI 23.44 kg/m   Physical Exam  Constitutional: She is oriented to person, place, and time. She appears well-developed.  HENT:  Head: Normocephalic.  Right Ear: External ear normal.  Left Ear: External ear normal.  Eyes: Pupils are equal, round, and reactive to light.  Neck: No tracheal deviation present. No thyromegaly present.  Cardiovascular: Normal rate.   Pulmonary/Chest: Effort normal.  Abdominal: Soft.  Musculoskeletal:  Pelvis is level. Slight birthmark mild port wine color at L5-S1 no midline defects. No setting notes tenderness trace tenderness of the trochanteric bursa right and left. History leg raising 90. Tenderness of the iliotibial band right knee. Knee reaches full extension crepitus negative patellar subluxation. Pes bursa is normal distal pulses are normal. No venous stasis changes no knee effusion.  Neurological: She is alert and oriented to person, place, and time.  Skin: Skin is warm and dry.  Psychiatric: She has a normal mood and affect. Her behavior is normal.    Ortho Exam  Specialty Comments:  No specialty comments available.  Imaging: No results found.   PMFS History: Patient Active Problem List   Diagnosis Date Noted  . Osteoporosis 06/17/2016  . Atypical pigmented skin lesion 05/21/2015  . 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 Spasm of muscle 07/29/2014  . Stiffness of joints, not elsewhere classified, multiple sites 07/29/2014  . Osteopenia 06/05/2014  . Cervical nerve root compression 06/05/2014  . Osteoporosis, unspecified 05/24/2013  . Hyperlipemia 05/24/2013  . Migraines 05/03/2013   Past Medical History:  Diagnosis Date  .  Heart murmur 1993  . Migraine   . Osteoporosis     Family History  Problem Relation Age of Onset  . Cancer Mother     Lung  . Heart attack Father     Past Surgical History:  Procedure Laterality Date  . CESAREAN SECTION     x2  . COLONOSCOPY     Social History   Occupational History  . Not on file.   Social History Main Topics  . Smoking status: Never Smoker  . Smokeless tobacco: Never Used  . Alcohol use Not on file  . Drug use: Unknown  . Sexual activity: Not on file

## 2016-12-23 MED ORDER — BUPIVACAINE HCL 0.25 % IJ SOLN
0.6600 mL | INTRAMUSCULAR | Status: AC | PRN
  Administered 2016-12-22: .66 mL via INTRA_ARTICULAR

## 2016-12-23 MED ORDER — METHYLPREDNISOLONE ACETATE 40 MG/ML IJ SUSP
40.0000 mg | INTRAMUSCULAR | Status: AC | PRN
  Administered 2016-12-22: 40 mg via INTRA_ARTICULAR

## 2016-12-23 MED ORDER — LIDOCAINE HCL 1 % IJ SOLN
1.0000 mL | INTRAMUSCULAR | Status: AC | PRN
  Administered 2016-12-22: 1 mL

## 2016-12-29 ENCOUNTER — Ambulatory Visit (INDEPENDENT_AMBULATORY_CARE_PROVIDER_SITE_OTHER): Payer: BLUE CROSS/BLUE SHIELD | Admitting: Orthopaedic Surgery

## 2016-12-29 ENCOUNTER — Telehealth (INDEPENDENT_AMBULATORY_CARE_PROVIDER_SITE_OTHER): Payer: Self-pay | Admitting: *Deleted

## 2016-12-29 ENCOUNTER — Encounter (INDEPENDENT_AMBULATORY_CARE_PROVIDER_SITE_OTHER): Payer: Self-pay | Admitting: Orthopaedic Surgery

## 2016-12-29 VITALS — BP 141/78 | HR 72 | Ht 60.0 in | Wt 120.0 lb

## 2016-12-29 DIAGNOSIS — M7631 Iliotibial band syndrome, right leg: Secondary | ICD-10-CM

## 2016-12-29 NOTE — Progress Notes (Signed)
Office Visit Note   Patient: Susan Liu           Date of Birth: 02/02/1956           MRN: [id number] Visit Date: 12/29/2016              Requested by: Kathyrn Drown, MD [place] [address] [place], [place] [address] PCP: Sallee Lange, MD   Assessment & Plan: Visit Diagnoses:  1. Iliotibial band syndrome of right side     Plan: Yesterday pain patient related her pain as a 10. She points the lateral joint line which stabbing pain she had difficulty walking is concerned she is nonambulatory continue working. Patient states she liked proceed with an MRI scan of her knee. We'll obtain the MRI to rule out lateral meniscal tear or prior meniscal cyst with area of tenderness.  Follow-Up Instructions: Patient will return after MRI scan for review.  Orders:  No orders of the defined types were placed in this encounter.  No orders of the defined types were placed in this encounter.     Procedures: No procedures performed   Clinical Data: No additional findings.   Subjective: Chief Complaint  Patient presents with  . Right Knee - Pain    Patient returns with right knee pain. She was in the office one week ago and received iliotibial band injection which provided no relief. She states that the pain is no better and that yesterday was the worst it has been. She had to ice it and took about 8 ibuprofen yesterday. She is unsure if she has swelling.   Patient states her pain yesterday was a 10 out of 10. She's been taking maximum dose of ibuprofen and states it's somewhat better today. She can only walk about 10-15 minutes and then has to stop quit and go ice her knee. She points the iliotibial band at the femoral condyle and the lateral joint line where she is having pain.  Review of Systems 14 for review of systems updated and is unchanged from last office visit last week other than several days of increasing pain and difficulty walking difficulty  sleeping.   Objective: Vital Signs: BP (!) 141/78   Pulse 72   Ht 5' (1.524 m)   Wt 120 lb (54.4 kg)   BMI 23.44 kg/m   Physical Exam  Constitutional: She is oriented to person, place, and time. She appears well-developed.  HENT:  Head: Normocephalic.  Right Ear: External ear normal.  Left Ear: External ear normal.  Eyes: Pupils are equal, round, and reactive to light.  Neck: No tracheal deviation present. No thyromegaly present.  Cardiovascular: Normal rate.   Pulmonary/Chest: Effort normal.  Abdominal: Soft.  Musculoskeletal:  Patient has pain at the lateral joint line adjacent to the lateral collateral ligament. Tib-fib joint, common peroneal nerve at the fibular neck is normal. Anterior cruciate ligament PCL exam is normal. Collateral ligament testing is normal. She's tender over the iliotibial band distally some tenderness proximally over the trochanter no sciatic notch tenderness negative straight leg raising 90. Normal heel-to-toe gait today. No Crepitus with patellar loading. Normal patellar tracking. Anterior cruciate ligament PCL exam is normal. She has significant tenderness along the lateral joint line just posterior to the lateral collateral ligament. Upper Baker's cyst distal pulses are intact negative Homans, calf is normal anterior tib EHL foot dorsiflexion is normal.  Neurological: She is alert and oriented to person, place, and time.  Skin: Skin is warm  and dry.  Psychiatric: She has a normal mood and affect. Her behavior is normal.    Ortho Exam  Specialty Comments:  No specialty comments available.  Imaging: No results found.   PMFS History: Patient Active Problem List   Diagnosis Date Noted  . Osteoporosis 06/17/2016  . Atypical pigmented skin lesion 05/21/2015  . 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 Spasm of muscle 07/29/2014  . Stiffness of joints, not elsewhere classified, multiple sites 07/29/2014  . Osteopenia 06/05/2014  . Cervical nerve root  compression 06/05/2014  . Osteoporosis, unspecified 05/24/2013  . Hyperlipemia 05/24/2013  . Migraines 05/03/2013   Past Medical History:  Diagnosis Date  . Heart murmur 1993  . Migraine   . Osteoporosis     Family History  Problem Relation Age of Onset  . Cancer Mother     Lung  . Heart attack Father     Past Surgical History:  Procedure Laterality Date  . CESAREAN SECTION     x2  . COLONOSCOPY     Social History   Occupational History  . Not on file.   Social History Main Topics  . Smoking status: Never Smoker  . Smokeless tobacco: Never Used  . Alcohol use Not on file  . Drug use: Unknown  . Sexual activity: Not on file

## 2016-12-29 NOTE — Addendum Note (Signed)
Addended by: Meyer Cory on: 12/29/2016 10:27 AM   Modules accepted: Orders

## 2016-12-29 NOTE — Telephone Encounter (Signed)
Pt has appt for MRI R Knee at [facility] on [address] Feb 8 at 3pm, pt needs to arrive 15 mins early to register, Lmtrc to pt for appt information

## 2016-12-30 NOTE — Telephone Encounter (Signed)
Pt called back stating is aware of appt by [facility]

## 2016-12-30 NOTE — Telephone Encounter (Signed)
Pt called left  Message stating returning my call, I called pt back at work number given, someone hung up on me, tried calling back no answer, will wait for pt to return call

## 2017-01-05 ENCOUNTER — Ambulatory Visit (HOSPITAL_COMMUNITY)
Admission: RE | Admit: 2017-01-05 | Discharge: 2017-01-05 | Disposition: A | Discharge status: Home / Self Care | Payer: BLUE CROSS/BLUE SHIELD | Source: Ambulatory Visit | Attending: Orthopaedic Surgery | Admitting: Orthopaedic Surgery

## 2017-01-05 DIAGNOSIS — M7631 Iliotibial band syndrome, right leg: Secondary | ICD-10-CM | POA: Insufficient documentation

## 2017-01-05 DIAGNOSIS — M2241 Chondromalacia patellae, right knee: Secondary | ICD-10-CM | POA: Diagnosis not present

## 2017-01-19 ENCOUNTER — Ambulatory Visit (INDEPENDENT_AMBULATORY_CARE_PROVIDER_SITE_OTHER): Payer: BLUE CROSS/BLUE SHIELD | Admitting: Orthopaedic Surgery

## 2017-01-19 ENCOUNTER — Encounter (INDEPENDENT_AMBULATORY_CARE_PROVIDER_SITE_OTHER): Payer: Self-pay | Admitting: Orthopaedic Surgery

## 2017-01-19 VITALS — BP 133/85 | HR 67 | Ht 60.0 in | Wt 120.0 lb

## 2017-01-19 DIAGNOSIS — M25561 Pain in right knee: Secondary | ICD-10-CM | POA: Diagnosis not present

## 2017-01-19 DIAGNOSIS — G8929 Other chronic pain: Secondary | ICD-10-CM

## 2017-01-19 NOTE — Progress Notes (Signed)
Office Visit Note   Patient: Susan Liu           Date of Birth: 01-Sep-1956           MRN: [id number] Visit Date: 01/19/2017              Requested by: Kathyrn Drown, MD [place], [place] [address] PCP: Sallee Lange, MD   Assessment & Plan: Visit Diagnoses:  1. Chronic pain of right knee     Plan: MRI scan is reviewed ligaments are intact she has mild chondral wear particularly in the lateral compartment where she's been tender and symptomatic. She uses exercise bike avoiding doing much walking continue with swimming program and I'll recheck her in 4 weeks. I discussed with her at this point no arthroscopy is indicated and she does not have significant enough knee arthritis that it appears that she would need surgery anytime soon. Hopefully her symptoms will settle down with the bike and avoidance of impact loading for short period of time. She likely has a chondral area that she has been irritating with activity  Follow-Up Instructions: Return in about 4 weeks (around 02/16/2017).   Orders:  No orders of the defined types were placed in this encounter.  No orders of the defined types were placed in this encounter.     Procedures: No procedures performed   Clinical Data: No additional findings.   Subjective: Chief Complaint  Patient presents with  . Right Knee - Pain, Follow-up    Patient returns to review MRI of her knee. She states there have been no changes since her last appointment.     Review of Systems  Constitutional: Negative for chills and diaphoresis.  HENT: Negative for ear discharge, ear pain and nosebleeds.   Eyes: Negative for discharge and visual disturbance.  Respiratory: Negative for cough, choking and shortness of breath.   Cardiovascular: Negative for chest pain and palpitations.  Gastrointestinal: Negative for abdominal distention and abdominal pain.  Endocrine: Negative for cold intolerance and heat intolerance.    Genitourinary: Negative for flank pain and hematuria.  Skin: Negative for rash and wound.  Neurological: Negative for seizures and speech difficulty.  Hematological: Negative for adenopathy. Does not bruise/bleed easily.  Psychiatric/Behavioral: Negative for agitation and suicidal ideas.  14 point review of systems is updated from last visit and is unchanged. Injection in her knee gave her only temporary relief. She's tried to walk in the neighborhood with her husband and can only go a couple blocks and then has to have a break and go back to the house and cannot continue due to knee pain with mild swelling. Patient's had her MRI and  is available for review .  Objective: Vital Signs: BP 133/85   Pulse 67   Ht 5' (1.524 m)   Wt 120 lb (54.4 kg)   BMI 23.44 kg/m   Physical Exam  Constitutional: She is oriented to person, place, and time. She appears well-developed.  HENT:  Head: Normocephalic.  Right Ear: External ear normal.  Left Ear: External ear normal.  Eyes: Pupils are equal, round, and reactive to light.  Neck: No tracheal deviation present. No thyromegaly present.  Cardiovascular: Normal rate.   Pulmonary/Chest: Effort normal.  Abdominal: Soft.  Musculoskeletal:  No knee effusion no sciatic notch tenderness pelvis is level negative straight leg raising negative popped a compression test. Tibial band is minimally tender. Full knee range of motion no locking catching anterior cruciate ligament  PCL exam is normal.  Neurological: She is alert and oriented to person, place, and time.  Skin: Skin is warm and dry.  Psychiatric: She has a normal mood and affect. Her behavior is normal.    Ortho Exam  Specialty Comments:  No specialty comments available.  Imaging: No results found. Study Result   CLINICAL DATA:  Right knee pain for 6 months.  EXAM: MRI OF THE RIGHT KNEE WITHOUT CONTRAST  TECHNIQUE: Multiplanar, multisequence MR imaging of the knee was performed.  No intravenous contrast was administered.  COMPARISON:  Radiographs 07/08/2016  FINDINGS: MENISCI  Medial meniscus:  Intact.  Lateral meniscus:  Intact.  LIGAMENTS  Cruciates:  Intact.  Collaterals:  Intact.  CARTILAGE  Patellofemoral:  Minimal chondromalacia along the patellar apex.  Medial:  Mild degenerative chondrosis/ chondromalacia.  Lateral: Mild degenerative chondrosis/ chondromalacia. Early joint space narrowing and spurring.  Joint:  No joint effusion or synovitis.  Popliteal Fossa:  No popliteal mass or Baker's cyst.  Extensor Mechanism: The patella retinacular structures are intact and the quadriceps and patellar tendons are intact.  Bones: No acute bony findings. No bone lesion or osteochondral abnormality.  Other: The knee musculature appears normal.  IMPRESSION: 1. Intact ligamentous structures and no acute bony findings. 2. No meniscal tears. 3. Mild chondromalacia patella. Mild lateral compartment degenerative changes. 4. No joint effusion or Baker's cyst.   Electronically Signed   By: Marijo Sanes M.D.   On: 01/05/2017 16:06      PMFS History: Patient Active Problem List   Diagnosis Date Noted  . Osteoporosis 06/17/2016  . Atypical pigmented skin lesion 05/21/2015  . 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 Spasm of muscle 07/29/2014  . Stiffness of joints, not elsewhere classified, multiple sites 07/29/2014  . Osteopenia 06/05/2014  . Cervical nerve root compression 06/05/2014  . Osteoporosis, unspecified 05/24/2013  . Hyperlipemia 05/24/2013  . Migraines 05/03/2013   Past Medical History:  Diagnosis Date  . Heart murmur 1993  . Migraine   . Osteoporosis     Family History  Problem Relation Age of Onset  . Cancer Mother     Lung  . Heart attack Father     Past Surgical History:  Procedure Laterality Date  . CESAREAN SECTION     x2  . COLONOSCOPY     Social History   Occupational History  . Not  on file.   Social History Main Topics  . Smoking status: Never Smoker  . Smokeless tobacco: Never Used  . Alcohol use Not on file  . Drug use: Unknown  . Sexual activity: Not on file

## 2017-02-16 ENCOUNTER — Encounter (INDEPENDENT_AMBULATORY_CARE_PROVIDER_SITE_OTHER): Payer: Self-pay | Admitting: Orthopaedic Surgery

## 2017-02-16 ENCOUNTER — Ambulatory Visit (INDEPENDENT_AMBULATORY_CARE_PROVIDER_SITE_OTHER): Payer: BLUE CROSS/BLUE SHIELD | Admitting: Orthopaedic Surgery

## 2017-02-16 VITALS — BP 135/79 | HR 74 | Ht 60.0 in | Wt 120.0 lb

## 2017-02-16 DIAGNOSIS — M7061 Trochanteric bursitis, right hip: Secondary | ICD-10-CM

## 2017-02-16 MED ORDER — BUPIVACAINE HCL 0.25 % IJ SOLN
2.0000 mL | INTRAMUSCULAR | Status: AC | PRN
  Administered 2017-02-16: 2 mL via INTRA_ARTICULAR

## 2017-02-16 MED ORDER — METHYLPREDNISOLONE ACETATE 40 MG/ML IJ SUSP
40.0000 mg | INTRAMUSCULAR | Status: AC | PRN
  Administered 2017-02-16: 40 mg via INTRA_ARTICULAR

## 2017-02-16 MED ORDER — LIDOCAINE HCL 1 % IJ SOLN
0.5000 mL | INTRAMUSCULAR | Status: AC | PRN
  Administered 2017-02-16: .5 mL

## 2017-02-16 NOTE — Progress Notes (Signed)
Office Visit Note   Patient: Susan Liu           Date of Birth: [birth year]           MRN: [id number] Visit Date: 02/16/2017              Requested by: Kathyrn Drown, MD [place],  [address] PCP: Sallee Lange, MD   Assessment & Plan: Visit Diagnoses:  1. Trochanteric bursitis, right hip     Plan:MRI lumbar results reviewed from [facility] I gave her a copy of the report. Trochanteric injection performed which she tolerated well. We'll see if this helps her pain. If she has ongoing symptoms I recommend referral to [facility] sports medicine department for evaluation. She would need copies of her knee MRI as well as office notes for this. Hopefully symptoms will settle down and she can resume activities. She can start back with the ball exercises she was previously doing and then gradually progressed.   Follow-Up Instructions: Return if symptoms worsen or fail to improve.   Orders:  Orders Placed This Encounter  Procedures  . Large Joint Injection/Arthrocentesis   No orders of the defined types were placed in this encounter.     Procedures: Large Joint Inj Date/Time: 02/16/2017 12:38 PM Performed by: Marybelle Killings Authorized by: Rodell Perna C   Location:  Hip Site:  R greater trochanter Needle Size:  22 G Approach:  Lateral Ultrasound Guidance: No   Fluoroscopic Guidance: No   Arthrogram: No   Medications:  0.5 mL lidocaine 1 %; 2 mL bupivacaine 0.25 %; 40 mg methylPREDNISolone acetate 40 MG/ML Aspiration Attempted: No       Clinical Data: No additional findings.   Subjective: Chief Complaint  Patient presents with  . Right Knee - Pain    Patient returns for four week follow up right knee. She is still unable to walk for any period of time. She states that she was trying to put books away the other day and had to sit down. She is not using the stairs. She has been using the exercise bike. She continues to have the intermittent pain  and takes ibuprofen for that which helps with discomfort, but not with the ability of being able to walk.   Patient is unable to walk with her husband. After wash starts having increased pain as a stop and let back home. She did not be able do exercise on the ball use the elliptical and other activities she previously did a regular workout time. She does not have pain when she swims but per verbal orders very cold. She is tearful discussing not being able walk with her husband on a regular basis.  Review of Systems no fever or chills associated back pain. Previous MRI showed some T 11-12 diastematomyelia without any evidence of tethering. She had a small central bulge at L5-S1. No compression. Previous MRI of her knee showed some lateral compartment, where no meniscal tear. Rest of 14 point review of systems is unchanged.   Objective: Vital Signs: BP 135/79   Pulse 74   Ht 5' (1.524 m)   Wt 120 lb (54.4 kg)   BMI 23.44 kg/m   Physical Exam  Constitutional: She is oriented to person, place, and time. She appears well-developed.  HENT:  Head: Normocephalic.  Right Ear: External ear normal.  Left Ear: External ear normal.  Eyes: Pupils are equal, round, and reactive to light.  Neck: No tracheal  deviation present. No thyromegaly present.  Cardiovascular: Normal rate.   Pulmonary/Chest: Effort normal.  Abdominal: Soft.  Musculoskeletal:  Patient has no tenderness over lateral femoral condyle at the iliotibial band. Collateral ligaments are stable no knee effusion. Minimal joint line tenderness laterally none medially before meals O exam is normal no pitting edema undersurface of her feet are normal pulses are normal by isolated motor weakness no hip flexion weakness negative Corky Sox test. She's tender over the right greater trochanter which radiates pain down the mid thigh region. This is a new finding. Negative straight leg raising 90.  Neurological: She is alert and oriented to person, place,  and time.  Skin: Skin is warm and dry.  Psychiatric: She has a normal mood and affect. Her behavior is normal.    Ortho Exam  Specialty Comments:  No specialty comments available.  Imaging: Previous MRI done at [facility] on 09/10/2016 showed mild facet hypertrophy at L3-4 and L4-5. L5-S1 small central disc herniation with mild facet hypertrophy without canal or foraminal stenosis.   PMFS History: Patient Active Problem List   Diagnosis Date Noted  . Trochanteric bursitis, right hip 02/16/2017  . Osteoporosis 06/17/2016  . Atypical pigmented skin lesion 05/21/2015  . 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 Spasm of muscle 07/29/2014  . Stiffness of joints, not elsewhere classified, multiple sites 07/29/2014  . Osteopenia 06/05/2014  . Cervical nerve root compression 06/05/2014  . Osteoporosis, unspecified 05/24/2013  . Hyperlipemia 05/24/2013  . Migraines 05/03/2013   Past Medical History:  Diagnosis Date  . Heart murmur 1993  . Migraine   . Osteoporosis     Family History  Problem Relation Age of Onset  . Cancer Mother     Lung  . Heart attack Father     Past Surgical History:  Procedure Laterality Date  . CESAREAN SECTION     x2  . COLONOSCOPY     Social History   Occupational History  . Not on file.   Social History Main Topics  . Smoking status: Never Smoker  . Smokeless tobacco: Never Used  . Alcohol use Not on file  . Drug use: Unknown  . Sexual activity: Not on file

## 2017-03-02 ENCOUNTER — Encounter: Payer: Self-pay | Admitting: Nurse Practitioner

## 2017-03-03 ENCOUNTER — Encounter: Payer: Self-pay | Admitting: Nurse Practitioner

## 2017-03-07 ENCOUNTER — Other Ambulatory Visit: Payer: Self-pay | Admitting: *Deleted

## 2017-03-07 DIAGNOSIS — E785 Hyperlipidemia, unspecified: Secondary | ICD-10-CM

## 2017-03-07 DIAGNOSIS — Z79899 Other long term (current) drug therapy: Secondary | ICD-10-CM

## 2017-03-07 DIAGNOSIS — M810 Age-related osteoporosis without current pathological fracture: Secondary | ICD-10-CM

## 2017-03-23 ENCOUNTER — Other Ambulatory Visit: Payer: Self-pay | Admitting: Nurse Practitioner

## 2017-03-23 DIAGNOSIS — Z1231 Encounter for screening mammogram for malignant neoplasm of breast: Secondary | ICD-10-CM

## 2017-03-31 ENCOUNTER — Ambulatory Visit (HOSPITAL_COMMUNITY): Payer: BLUE CROSS/BLUE SHIELD

## 2017-04-05 ENCOUNTER — Encounter: Payer: Self-pay | Admitting: Family Medicine

## 2017-04-05 DIAGNOSIS — Z1211 Encounter for screening for malignant neoplasm of colon: Secondary | ICD-10-CM

## 2017-04-07 ENCOUNTER — Ambulatory Visit (HOSPITAL_COMMUNITY)
Admission: RE | Admit: 2017-04-07 | Discharge: 2017-04-07 | Disposition: A | Discharge status: Home / Self Care | Payer: BLUE CROSS/BLUE SHIELD | Source: Ambulatory Visit | Attending: Nurse Practitioner | Admitting: Nurse Practitioner

## 2017-04-07 DIAGNOSIS — Z1231 Encounter for screening mammogram for malignant neoplasm of breast: Secondary | ICD-10-CM | POA: Diagnosis present

## 2017-04-10 ENCOUNTER — Other Ambulatory Visit: Payer: Self-pay | Admitting: Nurse Practitioner

## 2017-04-10 DIAGNOSIS — R928 Other abnormal and inconclusive findings on diagnostic imaging of breast: Secondary | ICD-10-CM

## 2017-04-11 ENCOUNTER — Ambulatory Visit (HOSPITAL_COMMUNITY)
Admission: RE | Admit: 2017-04-11 | Discharge: 2017-04-11 | Disposition: A | Discharge status: Home / Self Care | Payer: BLUE CROSS/BLUE SHIELD | Source: Ambulatory Visit | Attending: Nurse Practitioner | Admitting: Nurse Practitioner

## 2017-04-11 DIAGNOSIS — R928 Other abnormal and inconclusive findings on diagnostic imaging of breast: Secondary | ICD-10-CM

## 2017-04-11 DIAGNOSIS — N6322 Unspecified lump in the left breast, upper inner quadrant: Secondary | ICD-10-CM | POA: Insufficient documentation

## 2017-05-02 ENCOUNTER — Telehealth: Payer: Self-pay

## 2017-05-02 ENCOUNTER — Encounter: Payer: Self-pay | Admitting: Family Medicine

## 2017-05-02 NOTE — Telephone Encounter (Signed)
[phone] or [phone] patient received letter to schedule tcs

## 2017-05-03 ENCOUNTER — Other Ambulatory Visit: Payer: Self-pay | Admitting: Nurse Practitioner

## 2017-05-03 MED ORDER — SUMATRIPTAN SUCCINATE 100 MG PO TABS: ORAL_TABLET | ORAL | 0 refills | Status: DC

## 2017-05-04 ENCOUNTER — Telehealth: Payer: Self-pay | Admitting: Family Medicine

## 2017-05-04 ENCOUNTER — Encounter: Payer: Self-pay | Admitting: Nurse Practitioner

## 2017-05-04 DIAGNOSIS — Z79899 Other long term (current) drug therapy: Secondary | ICD-10-CM

## 2017-05-04 DIAGNOSIS — E785 Hyperlipidemia, unspecified: Secondary | ICD-10-CM

## 2017-05-04 NOTE — Telephone Encounter (Signed)
Blood work ordered in EPIC. Patient notified. 

## 2017-05-04 NOTE — Telephone Encounter (Signed)
Please order lipid and liver tests.

## 2017-05-04 NOTE — Telephone Encounter (Signed)
Patient is requesting labs for physical in July with Hoyle Sauer.

## 2017-05-05 ENCOUNTER — Encounter: Payer: Self-pay | Admitting: Nurse Practitioner

## 2017-05-09 ENCOUNTER — Telehealth: Payer: Self-pay

## 2017-05-09 NOTE — Telephone Encounter (Signed)
See separate triage.  

## 2017-05-10 ENCOUNTER — Other Ambulatory Visit: Payer: Self-pay

## 2017-05-10 DIAGNOSIS — Z1211 Encounter for screening for malignant neoplasm of colon: Secondary | ICD-10-CM

## 2017-05-10 NOTE — Telephone Encounter (Signed)
Gastroenterology Pre-Procedure Review  Request Date: 05/09/2017 Requesting Physician: Dr. Sallee Lange  PATIENT REVIEW QUESTIONS: The patient responded to the following health history questions as indicated:    Last colonoscopy was 03/12/2007 by Dr. Gala Romney  1. Diabetes Melitis: no 2. Joint replacements in the past 12 months: no 3. Major health problems in the past 3 months: no 4. Has an artificial valve or MVP: no 5. Has a defibrillator: no 6. Has been advised in past to take antibiotics in advance of a procedure like teeth cleaning: no 7. Family history of colon cancer: no  8. Alcohol Use: no 9. History of sleep apnea: no  10. History of coronary artery or other vascular stents placed within the last 12 months: no    MEDICATIONS & ALLERGIES:    Patient reports the following regarding taking any blood thinners:   Plavix? no Aspirin? no Coumadin? no Brilinta? no Xarelto? no Eliquis? no Pradaxa? no Savaysa? no Effient? no  Patient confirms/reports the following medications:  Current Outpatient Prescriptions  Medication Sig Dispense Refill  . alendronate (FOSAMAX) 70 MG tablet TAKE 1 BY MOUTH EVERY 7 DAYS WITH A FULL GLASS OF WATER ON AN EMPTY STOMACH (CONTACT YOUR PROVIDER FOR AN APPOINTMENT) 12 tablet 1  . fluticasone (FLONASE) 50 MCG/ACT nasal spray instill 2 sprays into each nostril once daily (Patient taking differently: instill 2 sprays into each nostril once daily as needed) 16 g 5  . meloxicam (MOBIC) 7.5 MG tablet Take 7.5 mg by mouth 2 (two) times daily with a meal. As needed  0  . pravastatin (PRAVACHOL) 80 MG tablet TAKE 1 BY MOUTH DAILY 90 tablet 1  . SUMAtriptan (IMITREX) 100 MG tablet TAKE 1 BY MOUTH DAILY AS NEEDED FOR MIGRAINES AS DIRECTED 9 tablet 0  . cefdinir (OMNICEF) 300 MG capsule Take 1 capsule (300 mg total) by mouth 2 (two) times daily. (Patient not taking: Reported on 10/13/2016) 20 capsule 0   No current facility-administered medications for this visit.      Patient confirms/reports the following allergies:  Allergies  Allergen Reactions  . Erythromycin     No orders of the defined types were placed in this encounter.   AUTHORIZATION INFORMATION Primary Insurance:   ID #:   Group #:  Pre-Cert / Auth required:  Pre-Cert / Auth #:   Secondary Insurance:   ID #:   Group #:  Pre-Cert / Auth required:  Pre-Cert / Auth #:   SCHEDULE INFORMATION: Procedure has been scheduled as follows:  Date:  06/16/2017                    Time:  2:30 pm Location: [facility] Short Stay  This Gastroenterology Pre-Precedure Review Form is being routed to the following provider(s): R. Garfield Cornea, MD

## 2017-05-11 MED ORDER — NA SULFATE-K SULFATE-MG SULF 17.5-3.13-1.6 GM/177ML PO SOLN: 1.0000 | ORAL | 0 refills | Status: DC

## 2017-05-11 NOTE — Telephone Encounter (Signed)
Rx sent to the pharmacy and instructions mailed to pt.  

## 2017-05-11 NOTE — Telephone Encounter (Signed)
Ok to schedule.

## 2017-05-22 ENCOUNTER — Telehealth: Payer: Self-pay

## 2017-05-22 NOTE — Telephone Encounter (Signed)
Pt left Vm she will not be able to do the colonoscopy on 06/16/2017 due to work schedule and she wants to cancel.  I have returned her call and asked does she want to reschedule now.  Hoyle Sauer is aware in Endo and will just drop down until I return her call,

## 2017-05-29 NOTE — Telephone Encounter (Signed)
Letter reminder mailed to pt to call to reschedule.

## 2017-05-29 NOTE — Telephone Encounter (Signed)
Hoyle Sauer called from Endo. She is aware that pt has not rescheduled and we will just cancel and schedule when she calls.

## 2017-06-05 NOTE — Telephone Encounter (Signed)
No Pa is needed for TCS

## 2017-06-08 ENCOUNTER — Encounter: Payer: Self-pay | Admitting: Nurse Practitioner

## 2017-06-08 ENCOUNTER — Other Ambulatory Visit: Payer: Self-pay | Admitting: Nurse Practitioner

## 2017-06-08 MED ORDER — ALENDRONATE SODIUM 70 MG PO TABS: ORAL_TABLET | ORAL | 1 refills | Status: DC

## 2017-06-16 ENCOUNTER — Ambulatory Visit (HOSPITAL_COMMUNITY): Admit: 2017-06-16 | Payer: BLUE CROSS/BLUE SHIELD | Admitting: Internal Medicine

## 2017-06-16 ENCOUNTER — Encounter (HOSPITAL_COMMUNITY): Payer: Self-pay

## 2017-06-16 SURGERY — COLONOSCOPY
Anesthesia: Moderate Sedation

## 2017-06-22 ENCOUNTER — Encounter: Payer: Self-pay | Admitting: Nurse Practitioner

## 2017-06-22 ENCOUNTER — Ambulatory Visit (INDEPENDENT_AMBULATORY_CARE_PROVIDER_SITE_OTHER): Payer: Commercial Managed Care - PPO | Admitting: Nurse Practitioner

## 2017-06-22 VITALS — BP 122/84 | Ht 60.75 in | Wt 122.0 lb

## 2017-06-22 DIAGNOSIS — Z01419 Encounter for gynecological examination (general) (routine) without abnormal findings: Secondary | ICD-10-CM

## 2017-06-22 MED ORDER — PRAVASTATIN SODIUM 80 MG PO TABS: ORAL_TABLET | ORAL | 1 refills | Status: DC

## 2017-06-22 NOTE — Progress Notes (Signed)
   Subjective:    Patient ID: Susan Liu, female    DOB: March 23, 1956, 61 y.o.   MRN: [id number]  HPI presents for her wellness exam. No vaginal bleeding or pelvic pain. Same sexual partner. Regular vision and dental exams. Very active with swimming. Walking is limited due to orthopedic issues.  Depression screen PHQ 2/9 06/22/2017  Decreased Interest 1  Down, Depressed, Hopeless 1  PHQ - 2 Score 2  Altered sleeping 0  Tired, decreased energy 1  Change in appetite 0  Feeling bad or failure about yourself  1  Trouble concentrating 1  Moving slowly or fidgety/restless 0  Suicidal thoughts 1  PHQ-9 Score 6  Difficult doing work/chores Somewhat difficult   GAD 7 : Generalized Anxiety Score 06/22/2017  Nervous, Anxious, on Edge 0  Control/stop worrying 0  Worry too much - different things 1  Trouble relaxing 0  Restless 0  Easily annoyed or irritable 2  Afraid - awful might happen 1  Total GAD 7 Score 4  Anxiety Difficulty Somewhat difficult    Denies any true homicidal thoughts or ideation. Just tired and struggling with physical limitations from orthopedic issues. Defers treatment for anxiety or depression at this time.    Review of Systems  Constitutional: Negative for activity change, appetite change and fatigue.  HENT: Negative for dental problem, ear pain, sinus pressure and sore throat.   Respiratory: Negative for cough, chest tightness, shortness of breath and wheezing.   Cardiovascular: Negative for chest pain.  Gastrointestinal: Negative for abdominal distention, abdominal pain, blood in stool, constipation, diarrhea, nausea and vomiting.  Genitourinary: Negative for difficulty urinating, dysuria, enuresis, frequency, genital sores, pelvic pain, urgency, vaginal bleeding and vaginal discharge.  Psychiatric/Behavioral: Positive for dysphoric mood. Negative for suicidal ideas. The patient is nervous/anxious.        Objective:   Physical Exam  Constitutional: She is  oriented to person, place, and time. She appears well-developed. No distress.  HENT:  Right Ear: External ear normal.  Left Ear: External ear normal.  Mouth/Throat: Oropharynx is clear and moist.  Neck: Normal range of motion. Neck supple. No tracheal deviation present. No thyromegaly present.  Cardiovascular: Normal rate, regular rhythm and normal heart sounds.  Exam reveals no gallop.   No murmur heard. Pulmonary/Chest: Effort normal and breath sounds normal.  Abdominal: Soft. She exhibits no distension. There is no tenderness.  Genitourinary: Vagina normal and uterus normal. No vaginal discharge found.  Genitourinary Comments: External GU: no rashes or lesions. Vagina: no discharge. No CMT. Bimanual exam: no tenderness or obvious masses.   Musculoskeletal: She exhibits no edema.  Lymphadenopathy:    She has no cervical adenopathy.  Neurological: She is alert and oriented to person, place, and time.  Skin: Skin is warm and dry. No rash noted.  Psychiatric: She has a normal mood and affect. Her behavior is normal.  Vitals reviewed. Breast exam: dense tissue with minimal fine nodularity; no masses noted. Axillae no adenopathy.  Reviewed scanned lab report with patient from 03/22/17.      Assessment & Plan:  Well woman exam - Plan: IFOBT POC (occult bld, rslt in office)  Encouraged weight bearing exercises as tolerated. Continue vitamin D and Fosamax. Defers colonoscopy but agrees to iFOBT. Defers need for medication or counseling; agrees to call back if worse.  Return in about 1 year (around 06/22/2018) for physical.

## 2017-06-27 ENCOUNTER — Encounter: Payer: Self-pay | Admitting: Nurse Practitioner

## 2017-06-28 ENCOUNTER — Other Ambulatory Visit: Payer: Self-pay | Admitting: Nurse Practitioner

## 2017-06-28 MED ORDER — SUMATRIPTAN SUCCINATE 100 MG PO TABS
ORAL_TABLET | ORAL | 0 refills | Status: DC
Start: 2017-06-28 — End: 2017-08-18

## 2017-06-29 ENCOUNTER — Other Ambulatory Visit: Payer: Self-pay | Admitting: *Deleted

## 2017-06-29 DIAGNOSIS — Z01419 Encounter for gynecological examination (general) (routine) without abnormal findings: Secondary | ICD-10-CM

## 2017-06-29 LAB — IFOBT (OCCULT BLOOD): IFOBT: NEGATIVE

## 2017-08-18 ENCOUNTER — Encounter: Payer: Self-pay | Admitting: Nurse Practitioner

## 2017-08-18 ENCOUNTER — Other Ambulatory Visit: Payer: Self-pay | Admitting: Nurse Practitioner

## 2017-08-18 NOTE — Telephone Encounter (Signed)
Last seen 06/23/17

## 2017-09-08 ENCOUNTER — Encounter: Payer: Self-pay | Admitting: Family Medicine

## 2017-10-02 ENCOUNTER — Other Ambulatory Visit: Payer: Self-pay | Admitting: Family Medicine

## 2017-10-03 NOTE — Telephone Encounter (Signed)
Last seen 05/2017 for wellness

## 2017-11-14 ENCOUNTER — Encounter: Payer: Self-pay | Admitting: Family Medicine

## 2017-11-14 ENCOUNTER — Ambulatory Visit (INDEPENDENT_AMBULATORY_CARE_PROVIDER_SITE_OTHER): Payer: Commercial Managed Care - PPO | Admitting: Family Medicine

## 2017-11-14 VITALS — BP 124/78 | Ht 60.75 in | Wt 116.0 lb

## 2017-11-14 DIAGNOSIS — M25531 Pain in right wrist: Secondary | ICD-10-CM

## 2017-11-14 DIAGNOSIS — S60211A Contusion of right wrist, initial encounter: Secondary | ICD-10-CM

## 2017-11-14 MED ORDER — ALENDRONATE SODIUM 70 MG PO TABS: ORAL_TABLET | ORAL | 1 refills | Status: DC

## 2017-11-14 NOTE — Progress Notes (Signed)
   Subjective:    Patient ID: Susan Liu, female    DOB: 01/22/1956, 61 y.o.   MRN: [id number]  Wrist Pain   Pain location: right wrist. This is a new problem. Episode onset: 2 days. Treatments tried: ibuprofen. The treatment provided moderate relief.   Deep ache in wrist  realls no injury  ibu    Recalls no injuries at work  On further consideration patient recalls new exercise.  It involves outstretched arms against the wall.  In extension of the volar surface of the forearms as she weans into the wall.  Has been doing this the past week.  Positive  Let j fi  The    Review of Systems    No headache, no major weight loss or weight gain, no chest pain no back pain abdominal pain no change in bowel habits complete ROS otherwise negative  Objective:   Physical Exam  Alert vitals stable, NAD. Blood pressure good on repeat. HEENT normal. Lungs clear. Heart regular rate and rhythm. Tenderness at the base of the hand.  No obvious deformity.  Dorsiflexion of    impression injury secondary to exercise      Assessment & Plan:  No need for x-rays.  Local measures discussed anti-inflammatory medicine discussed contusion wrist

## 2018-02-27 ENCOUNTER — Encounter: Payer: Self-pay | Admitting: Family Medicine

## 2018-02-28 ENCOUNTER — Other Ambulatory Visit: Payer: Self-pay | Admitting: Family Medicine

## 2018-02-28 DIAGNOSIS — Z0000 Encounter for general adult medical examination without abnormal findings: Secondary | ICD-10-CM

## 2018-02-28 NOTE — Telephone Encounter (Signed)
Labs placed in Epic

## 2018-02-28 NOTE — Telephone Encounter (Signed)
Nurses- patient requesting orders for lab work.  She is requesting "everything that can be done "realistically it is impossible to order every lab tests ever known to man-but a reasonable lab testing would be the following-CBC, lipid, liver, TSH, metabolic 7, vitamin D-it is the patient's responsibility to make sure we get a copy of this lab work.  We also recommend that she follow-up if any of the lab work is abnormal-she needs to keep her annual visit with Hoyle Sauer in the summer

## 2018-03-23 ENCOUNTER — Telehealth: Payer: Self-pay | Admitting: Family Medicine

## 2018-03-23 NOTE — Telephone Encounter (Signed)
Review blood work results in [facility] from [facility].

## 2018-03-25 ENCOUNTER — Encounter: Payer: Self-pay | Admitting: Family Medicine

## 2018-03-25 ENCOUNTER — Telehealth: Payer: Self-pay | Admitting: Family Medicine

## 2018-03-25 NOTE — Telephone Encounter (Signed)
Lab work looks good letter was sent to the patient patient should always do a regular office visit

## 2018-03-25 NOTE — Telephone Encounter (Signed)
Lab work came in.  Overall fairly good but does need to be reviewed with the patient in a face-to-face visit.  Please scan a copy into the system send the patient a letter regarding her lab work she should call for setting up an appointment thanks

## 2018-05-08 ENCOUNTER — Encounter: Payer: Self-pay | Admitting: Nurse Practitioner

## 2018-05-11 ENCOUNTER — Other Ambulatory Visit: Payer: Self-pay | Admitting: Family Medicine

## 2018-05-15 ENCOUNTER — Encounter: Payer: Self-pay | Admitting: Family Medicine

## 2018-05-16 ENCOUNTER — Other Ambulatory Visit: Payer: Self-pay | Admitting: *Deleted

## 2018-05-16 MED ORDER — ALENDRONATE SODIUM 70 MG PO TABS: ORAL_TABLET | ORAL | 0 refills | Status: DC

## 2018-05-16 NOTE — Telephone Encounter (Signed)
May refill for 4 months keep ov with carolyn

## 2018-05-16 NOTE — Telephone Encounter (Signed)
Called pt and let her know that rx was sent to pharm.

## 2018-06-04 ENCOUNTER — Other Ambulatory Visit: Payer: Self-pay | Admitting: Nurse Practitioner

## 2018-06-12 ENCOUNTER — Encounter: Payer: Self-pay | Admitting: Family Medicine

## 2018-06-12 ENCOUNTER — Ambulatory Visit (INDEPENDENT_AMBULATORY_CARE_PROVIDER_SITE_OTHER): Payer: Commercial Managed Care - PPO | Admitting: Family Medicine

## 2018-06-12 VITALS — BP 124/88 | Ht 60.75 in | Wt 113.0 lb

## 2018-06-12 DIAGNOSIS — Z780 Asymptomatic menopausal state: Secondary | ICD-10-CM | POA: Diagnosis not present

## 2018-06-12 DIAGNOSIS — M810 Age-related osteoporosis without current pathological fracture: Secondary | ICD-10-CM | POA: Diagnosis not present

## 2018-06-12 DIAGNOSIS — R55 Syncope and collapse: Secondary | ICD-10-CM | POA: Diagnosis not present

## 2018-06-12 NOTE — Progress Notes (Signed)
   Subjective:    Patient ID: Susan Liu, female    DOB: [birth year], 62 y.o.   MRN: [id number]  HPIER follow up for near syncope.  Long discussion was held with the patient she relates that she had gone to her brothers surgery she was in the waiting area she was drinking a cup of coffee with family members felt lightheaded did not feel right she was brought to the emergency department extensive testing was completed overall the testing results look good they were all reviewed by myself.  This patient has had several other episodes for which she has gotten lightheaded and passed out she has never had any significant tachycardia chest pain no passing out with activity or exercise  Patient is eating very healthy but has resolved this has lost a fair amount of weight that has brought her down into a lower BMI which puts her at increased risk of additional osteoporosis as well as even increased risk of mortality should she get sick I discussed with her at length the importance of eating healthy but at the same time her goal should not be losing weight- she relates that she has not tried to lose weight it is just a byproduct of eating healthy but she will liberalize her diet a little bit more to allow herself to keep her weight where it is slightly higher    Review of Systems  Constitutional: Negative for activity change, appetite change and fatigue.  HENT: Negative for congestion, sinus pressure and sneezing.   Respiratory: Negative for cough and shortness of breath.   Cardiovascular: Negative for chest pain and leg swelling.  Gastrointestinal: Negative for abdominal pain and diarrhea.  Endocrine: Negative for polydipsia and polyphagia.  Skin: Negative for color change.  Neurological: Positive for dizziness and light-headedness. Negative for weakness and headaches.  Psychiatric/Behavioral: Negative for behavioral problems and confusion.       Objective:   Physical Exam  Constitutional: She  appears well-nourished. No distress.  Cardiovascular: Normal rate, regular rhythm and normal heart sounds.  No murmur heard. Pulmonary/Chest: Effort normal and breath sounds normal. No respiratory distress.  Musculoskeletal: She exhibits no edema.  Lymphadenopathy:    She has no cervical adenopathy.  Neurological: She is alert. She exhibits normal muscle tone.  Psychiatric: Her behavior is normal.  Vitals reviewed.  Romberg negative finger-to-nose normal no ataxia noted       Assessment & Plan:  I reviewed over the extensive amount of records from the ER Patient has intermittent vasovagal I find no evidence of any type of underlying tumor or stroke MS or other problems such as I do not recommend further testing I recommend the patient do a good job of staying healthy with her eating and keeping well hydrated  She has brought her weight down by eating healthy I believe she is potentially brought it down too much I encouraged her to be a little more liberal with her eating and see if that does not improve some of her symptoms  If she has ongoing troubles or tachycardia issues or further near passing out spells further evaluation by cardiology in a.m. neurology would be indicated  Will need to have bone density in August due to osteoporosis may need to take a holiday from the medicine because she has been on Fosamax for 5 years

## 2018-06-13 NOTE — Addendum Note (Signed)
Addended by: Karle Barr on: 06/13/2018 10:19 AM   Modules accepted: Orders

## 2018-06-13 NOTE — Progress Notes (Signed)
Pt returned call and pt stated that she saw this information on MyChart. Pt stated that she would be at this appointment.

## 2018-06-13 NOTE — Progress Notes (Signed)
Pt is sched for bone density test on Wednesday August 14,2019 be at Radiology at [facility] at 10:45 for a 11:00 appt. No calcium 24-48 hours prior and wear 2 pcs clothing, bring a list of medications. I have mailed her a card with this information and left her a message on her vm to r/c.

## 2018-06-25 ENCOUNTER — Telehealth: Payer: Self-pay | Admitting: Family Medicine

## 2018-06-25 ENCOUNTER — Ambulatory Visit (INDEPENDENT_AMBULATORY_CARE_PROVIDER_SITE_OTHER): Payer: Commercial Managed Care - PPO | Admitting: Nurse Practitioner

## 2018-06-25 ENCOUNTER — Encounter: Payer: Self-pay | Admitting: Nurse Practitioner

## 2018-06-25 VITALS — BP 122/86 | Ht 60.75 in | Wt 114.0 lb

## 2018-06-25 DIAGNOSIS — Z124 Encounter for screening for malignant neoplasm of cervix: Secondary | ICD-10-CM

## 2018-06-25 DIAGNOSIS — Z0000 Encounter for general adult medical examination without abnormal findings: Secondary | ICD-10-CM

## 2018-06-25 DIAGNOSIS — Z1211 Encounter for screening for malignant neoplasm of colon: Secondary | ICD-10-CM

## 2018-06-25 DIAGNOSIS — Z1151 Encounter for screening for human papillomavirus (HPV): Secondary | ICD-10-CM

## 2018-06-25 NOTE — Telephone Encounter (Signed)
Done

## 2018-06-25 NOTE — Telephone Encounter (Signed)
When patient checked out today, I told her Hoyle Sauer advised she come back in 6 months for a recheck.  She wants to know why she is supposed to come back?  She said we can send her a MyChart message.

## 2018-06-25 NOTE — Patient Instructions (Addendum)
[employer] 30 min massage [employer]

## 2018-06-26 ENCOUNTER — Encounter: Payer: Self-pay | Admitting: Nurse Practitioner

## 2018-06-26 NOTE — Progress Notes (Signed)
   Subjective:    Patient ID: KEIONA JENISON, female    DOB: August 12, 1956, 62 y.o.   MRN: [id number]  HPI Presents for her wellness exam. No further dizziness noted from previous visit. Healthy diet. Very active lifestyle. No pelvic pain or vaginal bleeding. Same sexual partner. Regular vision and dental exams. Has cut out sugar and white flour from diet which has helped with weight and energy.     Review of Systems  Constitutional: Negative for activity change, appetite change and fatigue.  HENT: Negative for dental problem, ear pain, sinus pressure and sore throat.   Respiratory: Negative for cough, chest tightness, shortness of breath and wheezing.   Cardiovascular: Negative for chest pain.  Gastrointestinal: Negative for abdominal distention, abdominal pain, blood in stool, constipation, diarrhea, nausea and vomiting.  Genitourinary: Negative for difficulty urinating, dysuria, enuresis, frequency, genital sores, pelvic pain, urgency, vaginal bleeding and vaginal discharge.       Objective:   Physical Exam  Constitutional: She is oriented to person, place, and time. She appears well-developed. No distress.  HENT:  Right Ear: External ear normal.  Left Ear: External ear normal.  Mouth/Throat: Oropharynx is clear and moist.  Neck: Normal range of motion. Neck supple. No tracheal deviation present. No thyromegaly present.  Cardiovascular: Normal rate, regular rhythm and normal heart sounds. Exam reveals no gallop.  No murmur heard. Pulmonary/Chest: Effort normal and breath sounds normal. Right breast exhibits no inverted nipple, no mass, no skin change and no tenderness. Left breast exhibits no inverted nipple, no mass, no skin change and no tenderness. Breasts are symmetrical.  Axillae no adenopathy.   Abdominal: Soft. She exhibits no distension. There is no tenderness.  Genitourinary: Vagina normal and uterus normal. No vaginal discharge found.  Genitourinary Comments: External GU: no  rashes or lesions. Vagina: no dischage; tissue pale but moist. Cervix normal in appearance. No CMT. Tenderness with speculum exam, particularly along the posterior vaginal wall. No masses or obvious rectocele noted. Bimanual exam: no pelvic tenderness or obvious masses.   Musculoskeletal: She exhibits no edema.  Lymphadenopathy:    She has no cervical adenopathy.  Neurological: She is alert and oriented to person, place, and time.  Skin: Skin is warm and dry. No rash noted.  Psychiatric: She has a normal mood and affect. Her behavior is normal.          Assessment & Plan:  Annual physical exam - Plan: Pap IG and HPV (high risk) DNA detection  Screening for cervical cancer - Plan: Pap IG and HPV (high risk) DNA detection  Screening for HPV (human papillomavirus) - Plan: Pap IG and HPV (high risk) DNA detection  Screening for colon cancer - Plan: IFOBT POC (occult bld, rslt in office)  Patient chooses to continue yearly iFOBT for colon cancer screening at this time.  Plans to get her mammogram every 2 years with neg family history.  Bone density has been ordered for later this summer.  Continue vitamin D, calcium and weight bearing exercises.  Return in about 1 year (around 06/26/2019) for physical.

## 2018-06-27 ENCOUNTER — Other Ambulatory Visit: Payer: Self-pay

## 2018-06-27 DIAGNOSIS — Z1211 Encounter for screening for malignant neoplasm of colon: Secondary | ICD-10-CM

## 2018-06-27 LAB — IFOBT (OCCULT BLOOD): IFOBT: NEGATIVE

## 2018-06-28 LAB — PAP IG AND HPV HIGH-RISK
HPV, high-risk: NEGATIVE
PAP Smear Comment: 0

## 2018-07-04 ENCOUNTER — Encounter: Payer: Self-pay | Admitting: Family Medicine

## 2018-07-07 ENCOUNTER — Encounter: Payer: Self-pay | Admitting: Family Medicine

## 2018-07-11 ENCOUNTER — Encounter: Payer: Self-pay | Admitting: Family Medicine

## 2018-07-11 ENCOUNTER — Ambulatory Visit (HOSPITAL_COMMUNITY)
Admission: RE | Admit: 2018-07-11 | Discharge: 2018-07-11 | Disposition: A | Discharge status: Home / Self Care | Payer: Commercial Managed Care - PPO | Source: Ambulatory Visit | Attending: Family Medicine | Admitting: Family Medicine

## 2018-07-11 DIAGNOSIS — M810 Age-related osteoporosis without current pathological fracture: Secondary | ICD-10-CM | POA: Diagnosis not present

## 2018-07-11 DIAGNOSIS — Z780 Asymptomatic menopausal state: Secondary | ICD-10-CM | POA: Insufficient documentation

## 2018-07-12 ENCOUNTER — Encounter: Payer: Self-pay | Admitting: Family Medicine

## 2018-07-12 NOTE — Telephone Encounter (Signed)
noted 

## 2018-07-12 NOTE — Telephone Encounter (Signed)
ok 

## 2018-07-23 ENCOUNTER — Encounter: Payer: Self-pay | Admitting: Family Medicine

## 2018-08-14 ENCOUNTER — Telehealth: Payer: Self-pay | Admitting: Family Medicine

## 2018-08-14 ENCOUNTER — Other Ambulatory Visit: Payer: Self-pay | Admitting: *Deleted

## 2018-08-14 MED ORDER — ALENDRONATE SODIUM 70 MG PO TABS: ORAL_TABLET | ORAL | 3 refills | Status: DC

## 2018-08-14 NOTE — Telephone Encounter (Signed)
Susan Liu from [facility] contacted to let us know that patient is transferring from [facility] to them but has no refills on Fosamax.

## 2018-08-14 NOTE — Telephone Encounter (Signed)
Refills sent to pharm

## 2018-08-14 NOTE — Telephone Encounter (Signed)
Please advise 

## 2018-08-14 NOTE — Telephone Encounter (Signed)
Fosamax, keep same directions, 12 tablets, 3 refills

## 2018-08-16 ENCOUNTER — Other Ambulatory Visit: Payer: Self-pay | Admitting: *Deleted

## 2018-08-16 MED ORDER — PRAVASTATIN SODIUM 80 MG PO TABS: ORAL_TABLET | ORAL | 1 refills | Status: DC

## 2018-10-01 ENCOUNTER — Other Ambulatory Visit: Payer: Self-pay | Admitting: Family Medicine

## 2018-11-08 ENCOUNTER — Encounter: Payer: Self-pay | Admitting: Family Medicine

## 2019-03-23 ENCOUNTER — Other Ambulatory Visit: Payer: Self-pay | Admitting: Family Medicine

## 2019-03-25 NOTE — Telephone Encounter (Signed)
May have 90-day refill will needFollow-up this summer

## 2019-04-09 ENCOUNTER — Encounter: Payer: Self-pay | Admitting: Family Medicine

## 2019-04-10 NOTE — Telephone Encounter (Signed)
She may have orders for the same exact labs as previous from last spring lipid, liver, vitamin D, CBC, metabolic 7 etc.

## 2019-04-12 ENCOUNTER — Telehealth: Payer: Self-pay | Admitting: Family Medicine

## 2019-04-12 DIAGNOSIS — E785 Hyperlipidemia, unspecified: Secondary | ICD-10-CM

## 2019-04-12 DIAGNOSIS — Z780 Asymptomatic menopausal state: Secondary | ICD-10-CM

## 2019-04-12 DIAGNOSIS — Z79899 Other long term (current) drug therapy: Secondary | ICD-10-CM

## 2019-04-12 NOTE — Telephone Encounter (Signed)
Blood work papers ordered in [employer]. Patient notified and will pick up order forms for her work to draw labs

## 2019-04-12 NOTE — Telephone Encounter (Signed)
Pt calling to check on lab work orders  Needs actual orders to take to lab (pt does not use [facility])  Please call pt when ready to pick up (pt sent [facility] message requesting this on Monday)

## 2019-04-12 NOTE — Telephone Encounter (Signed)
Susan Drown, MD       10:54 AM  Note    She may have orders for the same exact labs as previous from last spring lipid, liver, vitamin D, CBC, metabolic 7 etc.

## 2019-05-01 ENCOUNTER — Encounter: Payer: Self-pay | Admitting: Family Medicine

## 2019-05-01 NOTE — Telephone Encounter (Signed)
I looked in patient chart and I do not see any recent labs.

## 2019-05-02 ENCOUNTER — Encounter: Payer: Self-pay | Admitting: Family Medicine

## 2019-05-03 ENCOUNTER — Telehealth: Payer: Self-pay | Admitting: Family Medicine

## 2019-05-03 ENCOUNTER — Encounter: Payer: Self-pay | Admitting: Family Medicine

## 2019-05-03 NOTE — Telephone Encounter (Signed)
Labs were received by the patient who dropped them off.  [place] the [place].  CBC absolutely normal with hemoglobin 12.7 white count 7.2 Creatinine 0.78 sodium potassium sugar looks good. Total cholesterol 235 HDL 87 LDL 131. Liver enzymes normal.  Vitamin D level 54.1 Papers were scanned into the system I sent the patient a my chart message encouraging her to do follow-up

## 2019-06-26 ENCOUNTER — Other Ambulatory Visit: Payer: Self-pay | Admitting: Family Medicine

## 2019-07-15 ENCOUNTER — Other Ambulatory Visit: Payer: Self-pay | Admitting: Family Medicine

## 2019-07-15 NOTE — Telephone Encounter (Signed)
May have 3 months Needs follow-up office visit

## 2019-07-26 ENCOUNTER — Other Ambulatory Visit: Payer: Self-pay

## 2019-07-26 ENCOUNTER — Encounter: Payer: Self-pay | Admitting: Nurse Practitioner

## 2019-07-26 ENCOUNTER — Ambulatory Visit (INDEPENDENT_AMBULATORY_CARE_PROVIDER_SITE_OTHER): Payer: Commercial Managed Care - PPO | Admitting: Nurse Practitioner

## 2019-07-26 VITALS — BP 130/78 | Temp 96.2°F | Ht 59.5 in | Wt 116.6 lb

## 2019-07-26 DIAGNOSIS — Z0000 Encounter for general adult medical examination without abnormal findings: Secondary | ICD-10-CM | POA: Diagnosis not present

## 2019-07-26 DIAGNOSIS — Z1211 Encounter for screening for malignant neoplasm of colon: Secondary | ICD-10-CM | POA: Diagnosis not present

## 2019-07-26 DIAGNOSIS — Z1239 Encounter for other screening for malignant neoplasm of breast: Secondary | ICD-10-CM

## 2019-07-26 DIAGNOSIS — M7061 Trochanteric bursitis, right hip: Secondary | ICD-10-CM | POA: Diagnosis not present

## 2019-07-26 NOTE — Progress Notes (Signed)
Subjective:    Patient ID: Susan Liu, female    DOB: 1956-03-01, 63 y.o.   MRN: [id number]  HPI The patient comes in today for a wellness visit. Patient in overall good health. Patient eats a healthy diet and exercises daily. Patient normally walks, and lift smalls weights. Patient is married with one sexual partner. Patient complains of painful/discomfort with sexual intercourse. Has tried lubricants with little success.  Patient has regular dental and eye exams.   Depression screen [facility] 2/9 07/26/2019 06/22/2017  Decreased Interest 0 1  Down, Depressed, Hopeless 0 1  PHQ - 2 Score 0 2  Altered sleeping - 0  Tired, decreased energy - 1  Change in appetite - 0  Feeling bad or failure about yourself  - 1  Trouble concentrating - 1  Moving slowly or fidgety/restless - 0  Suicidal thoughts - 1  PHQ-9 Score - 6  Difficult doing work/chores - Somewhat difficult   A review of their health history was completed.  A review of medications was also completed.  Any needed refills; none at this time  Eating habits: health conscious   Falls/  MVA accidents in past few months: none  Regular exercise: trying to stay active  Specialist pt sees on regular basis: neurosurgeon once a year  Preventative health issues were discussed.   Additional concerns: right outer thigh issues; unable to sleep on area;hurts to touch or press on it. Chronic issue. Has tried multiple treatments in the past. No change.    Review of Systems  Constitutional: Negative for activity change, appetite change and fatigue.  HENT: Negative for dental problem, ear pain, sinus pressure and sore throat.   Respiratory: Negative for cough, chest tightness, shortness of breath and wheezing.   Cardiovascular: Negative for chest pain.  Gastrointestinal: Negative for abdominal distention, abdominal pain, constipation, diarrhea, nausea and vomiting.  Genitourinary: Positive for dyspareunia. Negative for difficulty  urinating, dysuria, enuresis, frequency, genital sores, pelvic pain, urgency and vaginal discharge.       Objective:   Physical Exam Constitutional:      General: She is not in acute distress.    Appearance: She is well-developed.  HENT:     Right Ear: External ear normal.     Left Ear: External ear normal.  Neck:     Musculoskeletal: Normal range of motion and neck supple.     Thyroid: No thyromegaly.     Trachea: No tracheal deviation.     Comments: Muscle tension on sternocleidomastoid and trapezius muscles Cardiovascular:     Rate and Rhythm: Normal rate and regular rhythm.     Heart sounds: Normal heart sounds. No murmur. No gallop.   Pulmonary:     Effort: Pulmonary effort is normal.     Breath sounds: Normal breath sounds.  Chest:     Chest wall: No mass or lacerations.     Breasts:        Right: Normal. No inverted nipple or mass.        Left: Normal. No inverted nipple or mass.  Abdominal:     General: There is no distension.     Palpations: Abdomen is soft.     Tenderness: There is no abdominal tenderness.  Genitourinary:    Comments: Defers exam; denies any problems.  Musculoskeletal:     Right hip: She exhibits decreased range of motion and tenderness.     Left hip: Normal.       Legs:  Comments: Right outer region  Lymphadenopathy:     Cervical: No cervical adenopathy.     Upper Body:     Right upper body: No supraclavicular, axillary or pectoral adenopathy.     Left upper body: No supraclavicular, axillary or pectoral adenopathy.  Skin:    General: Skin is warm and dry.     Findings: No rash.  Neurological:     Mental Status: She is alert and oriented to person, place, and time.  Psychiatric:        Behavior: Behavior normal.           Assessment & Plan:  1. Screening for colon cancer - IFOBT POC (occult bld, rslt in office); Future  2. Screening for breast cancer - MM DIGITAL SCREENING BILATERAL Problem List Items Addressed This Visit       Musculoskeletal and Integument   Trochanteric bursitis, right hip    Other Visit Diagnoses    Encounter for wellness examination in adult    -  Primary   Screening for colon cancer       Relevant Orders   IFOBT POC (occult bld, rslt in office)   Screening for breast cancer       Relevant Orders   MM DIGITAL SCREENING BILATERAL       -Encouraged continue good diet and exercise habits.  Recommended continued hip exercises, including internal/external hip rotation. Provided information about OTC lidocaine patches or Voltaren gel. Discussed if pain unchanged or worsen, please contact provider.  Recommended Tens Unit for muscle stiffness/pain in the neck region. Discussed Shingrix vaccine.   Return in about 1 year (around 07/25/2020).

## 2019-07-26 NOTE — Patient Instructions (Addendum)
-  Tens Unit for neck/back pain: Look at [email or web address]. Put on muscle. -Voltaren gel or Lidocaine patch to right hip. -Get Shingrix vaccine record. Look into Zostavax  -Get mammogram -Get IFOBT to check for colon cancer

## 2019-07-27 ENCOUNTER — Encounter: Payer: Self-pay | Admitting: Nurse Practitioner

## 2019-07-29 ENCOUNTER — Encounter: Payer: Self-pay | Admitting: Family Medicine

## 2019-08-01 ENCOUNTER — Other Ambulatory Visit: Payer: Self-pay | Admitting: *Deleted

## 2019-08-01 DIAGNOSIS — Z1211 Encounter for screening for malignant neoplasm of colon: Secondary | ICD-10-CM

## 2019-08-01 LAB — IFOBT (OCCULT BLOOD): IFOBT: NEGATIVE

## 2019-08-02 ENCOUNTER — Encounter: Payer: Commercial Managed Care - PPO | Admitting: Nurse Practitioner

## 2019-08-02 ENCOUNTER — Encounter

## 2019-08-12 ENCOUNTER — Other Ambulatory Visit: Payer: Self-pay

## 2019-08-12 ENCOUNTER — Ambulatory Visit (HOSPITAL_COMMUNITY)
Admission: RE | Admit: 2019-08-12 | Discharge: 2019-08-12 | Disposition: A | Discharge status: Home / Self Care | Payer: Commercial Managed Care - PPO | Source: Ambulatory Visit | Attending: Nurse Practitioner | Admitting: Nurse Practitioner

## 2019-08-12 DIAGNOSIS — Z1231 Encounter for screening mammogram for malignant neoplasm of breast: Secondary | ICD-10-CM | POA: Insufficient documentation

## 2019-08-12 DIAGNOSIS — Z1239 Encounter for other screening for malignant neoplasm of breast: Secondary | ICD-10-CM | POA: Diagnosis present

## 2019-09-30 ENCOUNTER — Other Ambulatory Visit: Payer: Self-pay | Admitting: Family Medicine

## 2019-11-01 ENCOUNTER — Encounter: Payer: Self-pay | Admitting: Family Medicine

## 2019-11-15 ENCOUNTER — Other Ambulatory Visit: Payer: Self-pay

## 2019-11-15 ENCOUNTER — Ambulatory Visit: Payer: Commercial Managed Care - PPO | Attending: Internal Medicine

## 2019-11-15 DIAGNOSIS — Z20822 Contact with and (suspected) exposure to covid-19: Secondary | ICD-10-CM

## 2019-11-16 LAB — NOVEL CORONAVIRUS, NAA: SARS-CoV-2, NAA: NOT DETECTED

## 2020-02-07 ENCOUNTER — Other Ambulatory Visit: Payer: Self-pay | Admitting: Family Medicine

## 2020-02-07 ENCOUNTER — Telehealth: Payer: Self-pay | Admitting: Family Medicine

## 2020-02-07 NOTE — Telephone Encounter (Signed)
Please send patient a MyChart message she needs to do a virtual follow-up visit May have 1 refill

## 2020-02-07 NOTE — Telephone Encounter (Signed)
Wellness 07/26/19

## 2020-02-12 NOTE — Telephone Encounter (Signed)
Patient wanting to schedule appointment instead of message.Please close

## 2020-02-21 ENCOUNTER — Encounter: Payer: Self-pay | Admitting: Family Medicine

## 2020-02-25 ENCOUNTER — Ambulatory Visit (INDEPENDENT_AMBULATORY_CARE_PROVIDER_SITE_OTHER): Payer: Commercial Managed Care - PPO | Admitting: Family Medicine

## 2020-02-25 ENCOUNTER — Other Ambulatory Visit: Payer: Self-pay

## 2020-02-25 ENCOUNTER — Ambulatory Visit (HOSPITAL_COMMUNITY)
Admission: RE | Admit: 2020-02-25 | Discharge: 2020-02-25 | Disposition: A | Discharge status: Home / Self Care | Payer: Commercial Managed Care - PPO | Source: Ambulatory Visit | Attending: Family Medicine | Admitting: Family Medicine

## 2020-02-25 DIAGNOSIS — M810 Age-related osteoporosis without current pathological fracture: Secondary | ICD-10-CM | POA: Diagnosis not present

## 2020-02-25 DIAGNOSIS — M533 Sacrococcygeal disorders, not elsewhere classified: Secondary | ICD-10-CM

## 2020-02-25 DIAGNOSIS — E785 Hyperlipidemia, unspecified: Secondary | ICD-10-CM | POA: Diagnosis not present

## 2020-02-25 DIAGNOSIS — Z1211 Encounter for screening for malignant neoplasm of colon: Secondary | ICD-10-CM | POA: Diagnosis not present

## 2020-02-25 MED ORDER — PRAVASTATIN SODIUM 80 MG PO TABS: 80.0000 mg | ORAL_TABLET | Freq: Every day | ORAL | 1 refills | Status: DC

## 2020-02-25 MED ORDER — ALENDRONATE SODIUM 70 MG PO TABS: ORAL_TABLET | ORAL | 3 refills | Status: DC

## 2020-02-25 NOTE — Progress Notes (Signed)
   Subjective:    Patient ID: Susan Liu, female    DOB: 16-Jan-1956, 64 y.o.   MRN: [id number]  Hyperlipidemia This is a chronic problem. The current episode started more than 1 year ago. Pertinent negatives include no chest pain or shortness of breath. Treatments tried: preavastatin. There are no compliance problems.  Risk factors for coronary artery disease include dyslipidemia.    Patient having issues with her SI joint today and wanted to know what was recommended. Patient is currently seeing a chiropractor and had a shot in the past and didn't know if a 2nd shot was recommended.  Virtual Visit via Video Note  I connected with Susan Liu on 02/25/20 at  1:10 PM EDT by a video enabled telemedicine application and verified that I am speaking with the correct person using two identifiers.  Location: Patient: home  Provider: office    I discussed the limitations of evaluation and management by telemedicine and the availability of in person appointments. The patient expressed understanding and agreed to proceed.  History of Present Illness:    Observations/Objective:   Assessment and Plan:   Follow Up Instructions:    I discussed the assessment and treatment plan with the patient. The patient was provided an opportunity to ask questions and all were answered. The patient agreed with the plan and demonstrated an understanding of the instructions.   The patient was advised to call back or seek an in-person evaluation if the symptoms worsen or if the condition fails to improve as anticipated.  I provided 22 minutes of non-face-to-face time during this encounter.      Review of Systems  Constitutional: Negative for activity change and appetite change.  HENT: Negative for congestion and rhinorrhea.   Respiratory: Negative for cough and shortness of breath.   Cardiovascular: Negative for chest pain and leg swelling.  Gastrointestinal: Negative for abdominal pain,  nausea and vomiting.  Musculoskeletal: Positive for back pain. Negative for joint swelling.  Skin: Negative for color change.  Neurological: Negative for dizziness and weakness.  Psychiatric/Behavioral: Negative for agitation and confusion.       Objective:   Physical Exam  Patient had virtual visit Appears to be in no distress Atraumatic Neuro able to relate and oriented No apparent resp distress Color normal       Assessment & Plan:  1. Osteoporosis without current pathological fracture, unspecified osteoporosis type Patient will be due for bone density later this year  2. Hyperlipidemia, unspecified hyperlipidemia type Hyperlipidemia-importance of diet, weight control, activity, compliance with medications discussed.  Recent labs reviewed.  Any additional labs or refills ordered.  Importance of keeping under good control discussed.   3. Sacro-iliac pain Recommend patient do lumbar x-rays depending on this may need to send her back to her neurosurgeon Dr. Prince Rome in [facility] may need injections - DG Lumbar Spine Complete  4. Screening for colon cancer Referral to gastroenterology - Ambulatory referral to Gastroenterology

## 2020-03-02 ENCOUNTER — Encounter: Payer: Self-pay | Admitting: Family Medicine

## 2020-03-02 DIAGNOSIS — Z79899 Other long term (current) drug therapy: Secondary | ICD-10-CM

## 2020-03-02 DIAGNOSIS — Z780 Asymptomatic menopausal state: Secondary | ICD-10-CM

## 2020-03-02 DIAGNOSIS — Z1321 Encounter for screening for nutritional disorder: Secondary | ICD-10-CM

## 2020-03-02 DIAGNOSIS — E785 Hyperlipidemia, unspecified: Secondary | ICD-10-CM

## 2020-03-02 NOTE — Telephone Encounter (Signed)
Visit on 3/30 pt states she was told she [place] I did not see any labs ordered in system and nothing at the front for pickup. Pt states she was told to do bw in may

## 2020-03-02 NOTE — Telephone Encounter (Signed)
Orders were forwarded last week   But here they are again Metabolic 7, CBC, vitamin D, lipid, liver thank you

## 2020-03-02 NOTE — Addendum Note (Signed)
Addended by: Vicente Males on: 03/02/2020 01:58 PM   Modules accepted: Orders

## 2020-03-03 ENCOUNTER — Encounter: Payer: Self-pay | Admitting: Family Medicine

## 2020-03-03 ENCOUNTER — Encounter: Payer: Self-pay | Admitting: Gastroenterology

## 2020-03-05 ENCOUNTER — Ambulatory Visit: Payer: Commercial Managed Care - PPO | Attending: Internal Medicine

## 2020-03-05 DIAGNOSIS — Z23 Encounter for immunization: Secondary | ICD-10-CM

## 2020-03-05 NOTE — Progress Notes (Signed)
   Covid-19 Vaccination Clinic  Name:  Susan Liu    MRN: [id number] DOB: 03/26/1956  03/05/2020  [name] was observed post Covid-19 immunization for 15 minutes without incident. She was provided with Vaccine Information Sheet and instruction to access the V-Safe system.   [name] was instructed to call 911 with any severe reactions post vaccine: Marland Kitchen Difficulty breathing  . Swelling of face and throat  . A fast heartbeat  . A bad rash all over body  . Dizziness and weakness   Immunizations Administered    Name Date Dose VIS Date Route   Moderna COVID-19 Vaccine 03/05/2020 11:58 AM 0.5 mL 10/29/2019 Intramuscular   Manufacturer: Moderna   Lot: [id number]   [id number]

## 2020-03-27 ENCOUNTER — Encounter: Payer: Self-pay | Admitting: Family Medicine

## 2020-03-30 ENCOUNTER — Ambulatory Visit (AMBULATORY_SURGERY_CENTER): Payer: Self-pay | Admitting: *Deleted

## 2020-03-30 ENCOUNTER — Other Ambulatory Visit: Payer: Self-pay

## 2020-03-30 VITALS — Temp 96.6°F | Ht 59.5 in | Wt 120.0 lb

## 2020-03-30 DIAGNOSIS — Z1211 Encounter for screening for malignant neoplasm of colon: Secondary | ICD-10-CM

## 2020-03-30 DIAGNOSIS — Z01818 Encounter for other preprocedural examination: Secondary | ICD-10-CM

## 2020-03-30 MED ORDER — SUTAB 1479-225-188 MG PO TABS: 1.0000 | ORAL_TABLET | ORAL | 0 refills | Status: DC

## 2020-03-30 NOTE — Progress Notes (Signed)
Patient is here in-person for PV. Patient denies any allergies to eggs or soy. Patient denies any problems with anesthesia/sedation. Patient denies any oxygen use at home. Patient denies taking any diet/weight loss medications or blood thinners. Patient is not being treated for MRSA or C-diff. Patient is aware of our care-partner policy and 0000000 safety protocol. EMMI education assisgned to the patient for the procedure, this was explained and instructions given to patient.   COVID-19 screening test is on 5/13, the pt is aware.   Prep Prescription coupon given to the patient.

## 2020-04-07 ENCOUNTER — Ambulatory Visit: Payer: Commercial Managed Care - PPO | Attending: Internal Medicine

## 2020-04-07 DIAGNOSIS — Z23 Encounter for immunization: Secondary | ICD-10-CM

## 2020-04-07 NOTE — Progress Notes (Signed)
   Covid-19 Vaccination Clinic  Name:  Susan Liu    MRN: [id number] DOB: 1956-09-27  04/07/2020  [name] was observed post Covid-19 immunization for 15 minutes without incident. She was provided with Vaccine Information Sheet and instruction to access the V-Safe system.   [name] was instructed to call 911 with any severe reactions post vaccine: Marland Kitchen Difficulty breathing  . Swelling of face and throat  . A fast heartbeat  . A bad rash all over body  . Dizziness and weakness   Immunizations Administered    Name Date Dose VIS Date Route   Moderna COVID-19 Vaccine 04/07/2020 11:56 AM 0.5 mL 10/2019 Intramuscular   Manufacturer: Moderna   Lot: [id number]   [id number]

## 2020-04-08 ENCOUNTER — Ambulatory Visit: Payer: Commercial Managed Care - PPO

## 2020-04-09 ENCOUNTER — Other Ambulatory Visit (HOSPITAL_COMMUNITY): Payer: Commercial Managed Care - PPO

## 2020-04-13 ENCOUNTER — Encounter: Payer: Commercial Managed Care - PPO | Admitting: Gastroenterology

## 2020-04-20 ENCOUNTER — Encounter: Payer: Self-pay | Admitting: Family Medicine

## 2020-04-21 NOTE — Telephone Encounter (Signed)
Labs are in dr scott's folder

## 2020-04-21 NOTE — Telephone Encounter (Signed)
Nurses Please forward to me lab results I have not seen it at this point. (Check to see if it has been faxed, if not connect with patient to have her ask them to refax) Then make sure this is forwarded to me with a message so we can know whether or not we need to do additional tests, set up an appointment, etc.

## 2020-04-22 ENCOUNTER — Other Ambulatory Visit: Payer: Self-pay | Admitting: *Deleted

## 2020-04-22 DIAGNOSIS — R7989 Other specified abnormal findings of blood chemistry: Secondary | ICD-10-CM

## 2020-04-22 NOTE — Telephone Encounter (Signed)
Discussed with pt and she verbalized understanding. Orders for cbc put in for pt to do in 2 weeks.

## 2020-04-22 NOTE — Telephone Encounter (Signed)
Nurses  I reviewed over the lab results Platelets are slightly elevated. Before embarking on a consultation with hematology I would recommend repeating the CBC in 2 weeks.  Platelet counts can fluctuate relating to inflammation and other factors and therefore based upon 1 reading I would not recommend consultation at this point.  If follow-up platelet count is elevated consultation with hematology would be reasonable.  Please connect with patient and have her do a CBC within the next 2 weeks thank

## 2020-04-27 ENCOUNTER — Telehealth: Payer: Self-pay | Admitting: Family Medicine

## 2020-04-27 NOTE — Telephone Encounter (Signed)
FYI CBC reviewed hemoglobin 12.8 hematocrit 39.3, platelets [id number] Metabolic 7 creatinine [id number] electrolytes look good LDL 124 HDL 83 Liver enzymes look good Vitamin D 56  Should be noted that the patient will do a CBC repeat in 1 month

## 2020-05-04 ENCOUNTER — Encounter: Payer: Self-pay | Admitting: Family Medicine

## 2020-05-07 LAB — CBC WITH DIFFERENTIAL/PLATELET
Basophils Absolute: 0.1 10*3/uL (ref 0.0–0.2)
Basos: 1 %
EOS (ABSOLUTE): 0.2 10*3/uL (ref 0.0–0.4)
Eos: 2 %
Hematocrit: 40.9 % (ref 34.0–46.6)
Hemoglobin: 13.1 g/dL (ref 11.1–15.9)
Immature Grans (Abs): 0 10*3/uL (ref 0.0–0.1)
Immature Granulocytes: 0 %
Lymphocytes Absolute: 3.3 10*3/uL — ABNORMAL HIGH (ref 0.7–3.1)
Lymphs: 44 %
MCH: 29.2 pg (ref 26.6–33.0)
MCHC: 32 g/dL (ref 31.5–35.7)
MCV: 91 fL (ref 79–97)
Monocytes Absolute: 0.8 10*3/uL (ref 0.1–0.9)
Monocytes: 11 %
Neutrophils Absolute: 3.1 10*3/uL (ref 1.4–7.0)
Neutrophils: 42 %
Platelets: 398 10*3/uL (ref 150–450)
RBC: 4.49 x10E6/uL (ref 3.77–5.28)
RDW: 12.7 % (ref 11.7–15.4)
WBC: 7.5 10*3/uL (ref 3.4–10.8)

## 2020-05-18 IMAGING — MG MM DIGITAL SCREENING BILAT W/ CAD
8 series · 9 of 24 positions shown · non-contrast
Comparison: Previous exam(s).

CLINICAL DATA: Screening.

EXAM:
DIGITAL SCREENING BILATERAL MAMMOGRAM WITH CAD

[R MLO synth-2D]
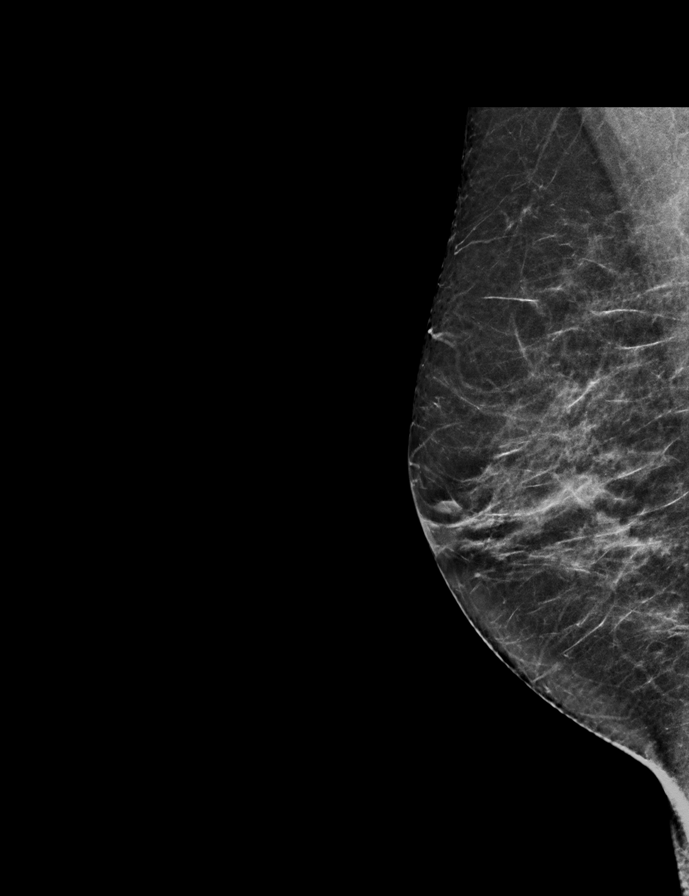

[L MLO synth-2D]
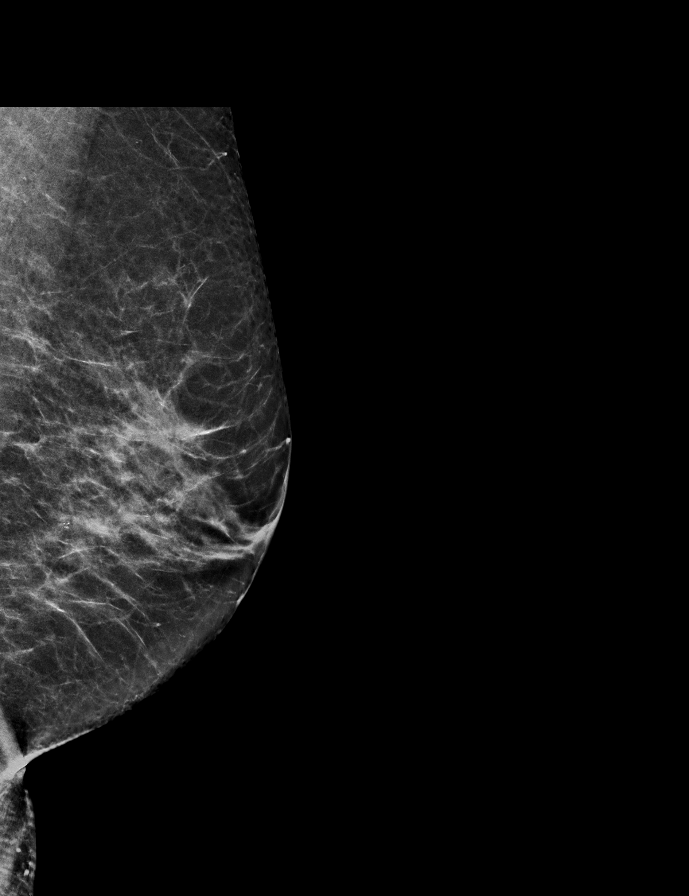

[R CC synth-2D]
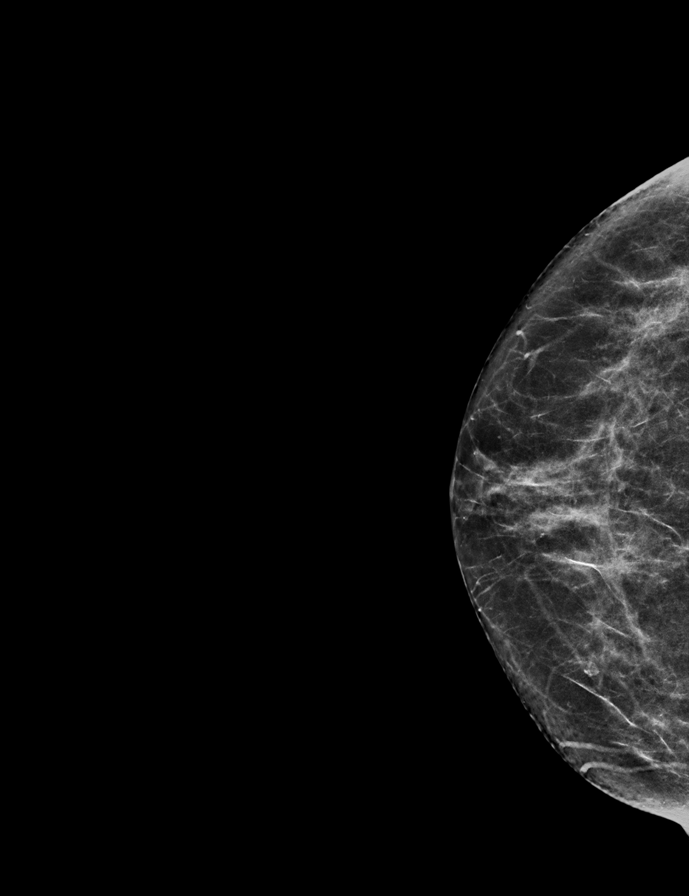

[L CC synth-2D]
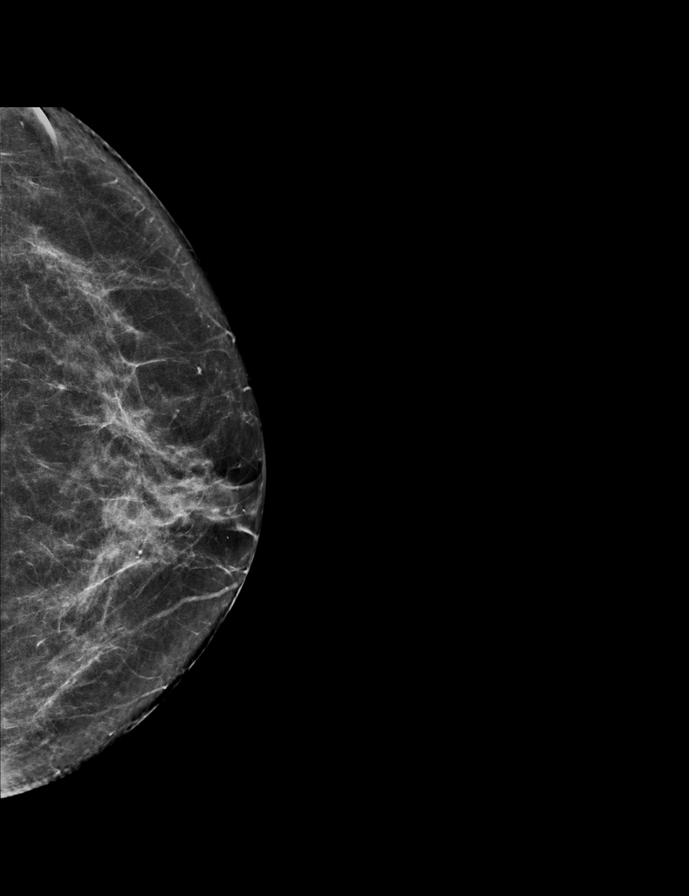

[R MLO tomo · 2 of 58 frames shown]
[frame 19/58]
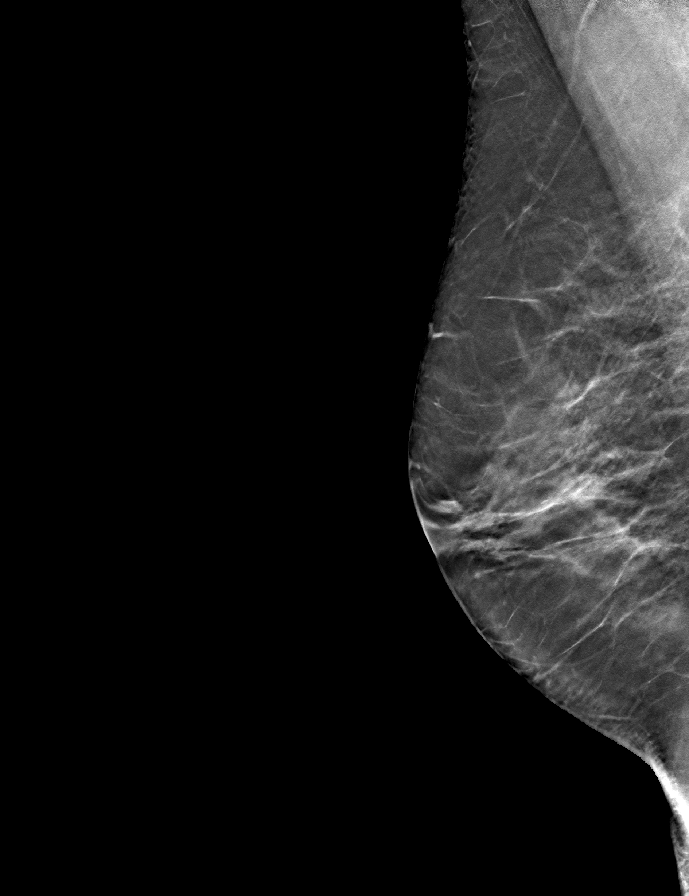
[frame 29/58]
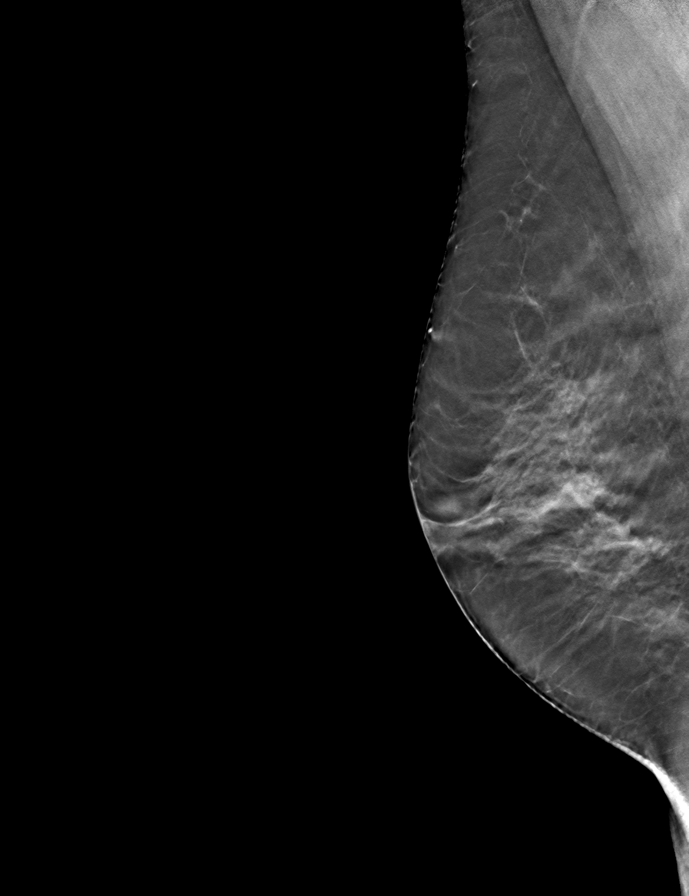

[L CC tomo · tomo slice 32/63.0]
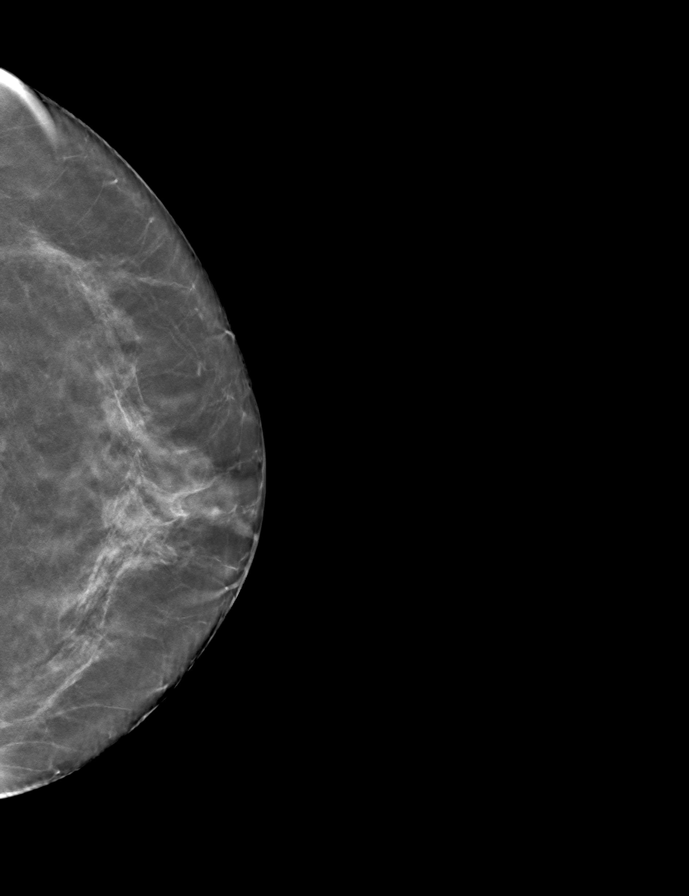

[R CC tomo · tomo slice 31/60.0]
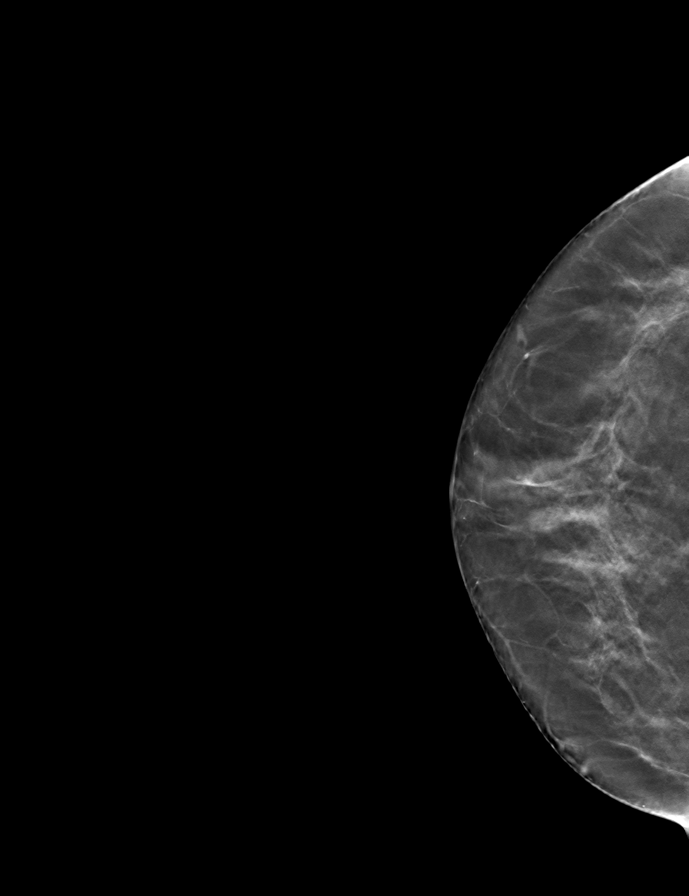

[L MLO tomo · tomo slice 31/60.0]
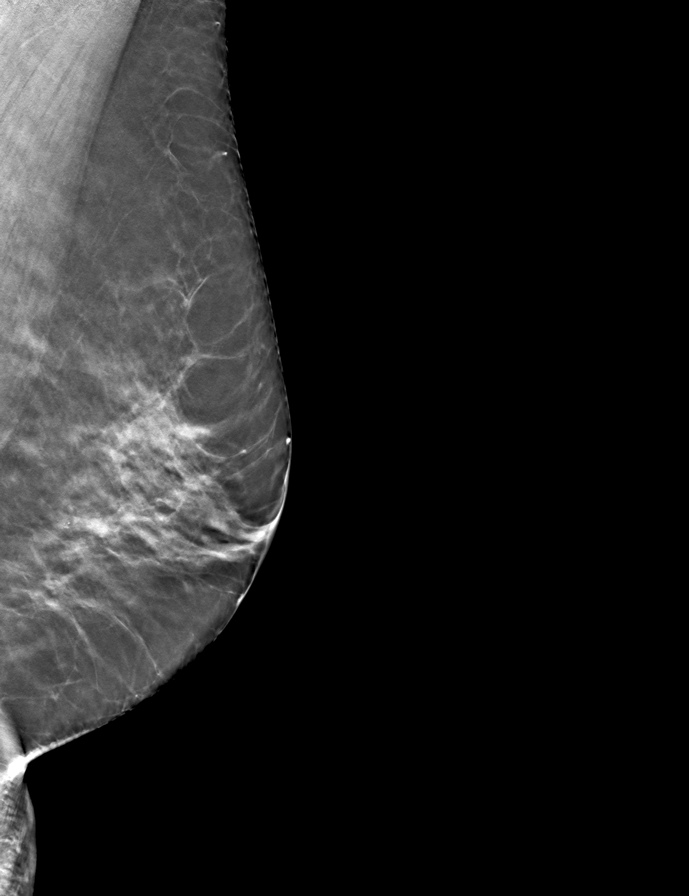

[9 of 24 positions shown; findings below may reference images not displayed]

ACR Breast Density Category c: The breast tissue is heterogeneously
dense, which may obscure small masses.
FINDINGS: There are no findings suspicious for malignancy. Images were
processed with CAD.
IMPRESSION: No mammographic evidence of malignancy. A result letter of this
screening mammogram will be mailed directly to the patient.

RECOMMENDATION:
Screening mammogram in one year. (Code:[id number])

BI-RADS CATEGORY  1: Negative.

## 2020-06-29 ENCOUNTER — Encounter: Payer: Self-pay | Admitting: Family Medicine

## 2020-06-29 DIAGNOSIS — M810 Age-related osteoporosis without current pathological fracture: Secondary | ICD-10-CM

## 2020-06-30 NOTE — Addendum Note (Signed)
Addended by: Vicente Males on: 06/30/2020 03:09 PM   Modules accepted: Orders

## 2020-06-30 NOTE — Telephone Encounter (Signed)
Nurses please set up bone density

## 2020-07-13 ENCOUNTER — Other Ambulatory Visit (HOSPITAL_COMMUNITY): Payer: Commercial Managed Care - PPO

## 2020-07-13 ENCOUNTER — Encounter: Payer: Self-pay | Admitting: Nurse Practitioner

## 2020-07-17 ENCOUNTER — Ambulatory Visit (HOSPITAL_COMMUNITY)
Admission: RE | Admit: 2020-07-17 | Discharge: 2020-07-17 | Disposition: A | Discharge status: Home / Self Care | Payer: Commercial Managed Care - PPO | Source: Ambulatory Visit | Attending: Family Medicine | Admitting: Family Medicine

## 2020-07-17 ENCOUNTER — Other Ambulatory Visit: Payer: Self-pay

## 2020-07-17 DIAGNOSIS — M810 Age-related osteoporosis without current pathological fracture: Secondary | ICD-10-CM | POA: Diagnosis present

## 2020-07-23 ENCOUNTER — Encounter: Payer: Self-pay | Admitting: Family Medicine

## 2020-07-23 ENCOUNTER — Other Ambulatory Visit: Payer: Self-pay | Admitting: *Deleted

## 2020-07-24 ENCOUNTER — Encounter: Payer: Self-pay | Admitting: Family Medicine

## 2020-07-24 ENCOUNTER — Other Ambulatory Visit: Payer: Self-pay | Admitting: Family Medicine

## 2020-07-24 DIAGNOSIS — M810 Age-related osteoporosis without current pathological fracture: Secondary | ICD-10-CM

## 2020-07-24 NOTE — Telephone Encounter (Signed)
Referral placed and demographics faxed to Dr.kerr office

## 2020-07-24 NOTE — Telephone Encounter (Signed)
Referral placed and fax over demographics to Dr.Kerrs office. Pt called into office inquiring about referral.

## 2020-07-24 NOTE — Progress Notes (Signed)
Error

## 2020-08-21 ENCOUNTER — Ambulatory Visit (INDEPENDENT_AMBULATORY_CARE_PROVIDER_SITE_OTHER): Payer: Commercial Managed Care - PPO | Admitting: Nurse Practitioner

## 2020-08-21 ENCOUNTER — Other Ambulatory Visit: Payer: Self-pay

## 2020-08-21 ENCOUNTER — Encounter: Payer: Self-pay | Admitting: Nurse Practitioner

## 2020-08-21 VITALS — BP 118/76 | Temp 97.1°F | Ht 60.0 in | Wt 119.6 lb

## 2020-08-21 DIAGNOSIS — Z01419 Encounter for gynecological examination (general) (routine) without abnormal findings: Secondary | ICD-10-CM

## 2020-08-21 NOTE — Progress Notes (Signed)
Subjective:    Patient ID: Susan Liu, female    DOB: 07/13/1956, 64 y.o.   MRN: [id number]  HPI  The patient comes in today for a wellness visit.  A review of their health history was completed. A review of medications was also completed.  Any needed refills; no  Eating habits: eating good  Falls/  MVA accidents in past few months: none  Regular exercise: reports swimming, walking and stretching 3-4 times a week  Specialist pt sees on regular basis: dentist, eye doctor, and dermatologist for skin cancer screening.  Preventative health issues were discussed. Colonoscopy scheduled for Nov. 3rd. Patient to receive flu vaccine via [facility] when she leaves office today. Expresses desire for the shingles vaccine.     Additional concerns: Pain in left arm                                   Osteoporosis concerns Review of Systems  Constitutional: Negative for activity change, appetite change, fatigue and fever.  Respiratory: Negative for cough, chest tightness, shortness of breath and wheezing.   Cardiovascular: Negative for chest pain.  Gastrointestinal: Negative for abdominal distention, abdominal pain, blood in stool, constipation, diarrhea, nausea and vomiting.  Genitourinary: Negative for difficulty urinating, dysuria, enuresis, frequency, genital sores, pelvic pain, urgency, vaginal bleeding and vaginal discharge.   Patient reports overall feeling well, Denies any headaches, nausea, vomiting, diarrhea, constipation. Reports no abnormal uterine bleeding, blood in her urine or stool. She has the same sexual partner.   She complains of mild pain in her left arm, starting at the anterior deltoid, the pain is described as a muscular, pulling pain that is exacerbated by external rotation. Pain in mainly in the anterior left shoulder at the biceps tendon. Internal rotation is not painful, this does cause some decreased ROM. Massage therapy was helpful temporarily, she is not taking  anything at this time for the pain. Continues normal activities.  Depression screen [facility] 2/9 08/21/2020 07/26/2019 06/22/2017  Decreased Interest 0 0 1  Down, Depressed, Hopeless 0 0 1  PHQ - 2 Score 0 0 2  Altered sleeping - - 0  Tired, decreased energy - - 1  Change in appetite - - 0  Feeling bad or failure about yourself  - - 1  Trouble concentrating - - 1  Moving slowly or fidgety/restless - - 0  Suicidal thoughts - - 1  PHQ-9 Score - - 6  Difficult doing work/chores - - Somewhat difficult       Objective:   Physical Exam Vitals and nursing note reviewed. Exam conducted with a chaperone present.  Constitutional:      General: She is not in acute distress.    Appearance: Normal appearance. She is well-developed and well-groomed.  Neck:     Thyroid: No thyroid mass, thyromegaly or thyroid tenderness.  Cardiovascular:     Rate and Rhythm: Normal rate and regular rhythm.     Pulses: Normal pulses.     Heart sounds: Normal heart sounds.  Pulmonary:     Effort: Pulmonary effort is normal.     Breath sounds: Normal breath sounds. No wheezing or rhonchi.  Chest:     Breasts:        Right: No swelling, inverted nipple, mass, nipple discharge, skin change or tenderness.        Left: No swelling, inverted nipple, mass, nipple discharge, skin change or tenderness.  Abdominal:     General: Abdomen is flat. There is no distension.     Palpations: Abdomen is soft. There is no mass.     Tenderness: There is no abdominal tenderness. There is no guarding.  Genitourinary:    Comments: External vagina is pink without any lesions, rash, or erythema. Internal vagina is pink without sores, lesions or discharge. Negative for CMT. Bimanual exam: no tenderness or obvious masses.  Musculoskeletal:     Cervical back: No tenderness.  Lymphadenopathy:     Cervical: No cervical adenopathy.     Upper Body:     Right upper body: No supraclavicular, axillary or pectoral adenopathy.     Left upper body:  No supraclavicular, axillary or pectoral adenopathy.  Skin:    General: Skin is warm and dry.  Neurological:     Mental Status: She is alert.  Psychiatric:        Mood and Affect: Mood normal.        Behavior: Behavior normal.        Thought Content: Thought content normal.        Judgment: Judgment normal.    Today's Vitals   08/21/20 0935  BP: 118/76  Temp: (!) 97.1 F (36.2 C)  TempSrc: Oral  Weight: 54.3 kg  Height: 5' (1.524 m)   Body mass index is 23.36 kg/m.        Assessment & Plan:   Problem List Items Addressed This Visit    None    Visit Diagnoses    Well woman exam    -  Primary      Reviewed recent lab work regarding osteoporosis. No TSH levels were listed, This was ordered, and patient will get this done outside through work and send [place] the results. Informed patient about the Shingrix vaccine and that this can be obtained at her local pharmacy.  Education provided for her left arm including Lobes exercises, ice/heat therapy, as well as using voltaren gel, biofreeze, and lidocaine patches. Informed patient to proceed with activity as tolerated. Call back in 2-3 weeks if no improvement.  Return in about 1 year (around 08/21/2021) for Annual wellness, physical.

## 2020-08-23 ENCOUNTER — Encounter: Payer: Self-pay | Admitting: Nurse Practitioner

## 2020-08-23 NOTE — Progress Notes (Signed)
   Subjective:    Patient ID: Susan Liu, female    DOB: 03-14-1956, 64 y.o.   MRN: [id number]  HPI    Review of Systems     Objective:   Physical Exam        Assessment & Plan:

## 2020-08-24 ENCOUNTER — Encounter: Payer: Self-pay | Admitting: Family Medicine

## 2020-08-24 ENCOUNTER — Encounter: Payer: Self-pay | Admitting: Nurse Practitioner

## 2020-09-01 ENCOUNTER — Encounter: Payer: Self-pay | Admitting: Nurse Practitioner

## 2020-09-04 ENCOUNTER — Encounter: Payer: Self-pay | Admitting: Nurse Practitioner

## 2020-09-04 NOTE — Telephone Encounter (Signed)
Hi [place]   I will forward your message to [place].  Your TSH looks good.  As for the results we do not doubt that your clinician faxed the information   May be within the next 20 years all healthcare systems will be on the same electronic record and we will not have this issue to have to deal with on a yearly basis....  TakeCare Dr. Nicki Reaper

## 2020-09-10 ENCOUNTER — Other Ambulatory Visit: Payer: Self-pay

## 2020-09-10 ENCOUNTER — Other Ambulatory Visit: Payer: Commercial Managed Care - PPO

## 2020-09-10 DIAGNOSIS — Z20822 Contact with and (suspected) exposure to covid-19: Secondary | ICD-10-CM

## 2020-09-11 LAB — NOVEL CORONAVIRUS, NAA: SARS-CoV-2, NAA: NOT DETECTED

## 2020-09-11 LAB — SPECIMEN STATUS REPORT

## 2020-09-11 LAB — SARS-COV-2, NAA 2 DAY TAT

## 2020-09-30 ENCOUNTER — Encounter: Payer: Commercial Managed Care - PPO | Admitting: Gastroenterology

## 2020-11-02 ENCOUNTER — Encounter: Payer: Self-pay | Admitting: Nurse Practitioner

## 2020-11-02 NOTE — Telephone Encounter (Signed)
Seen 9/24 for left arm pain

## 2020-11-03 ENCOUNTER — Other Ambulatory Visit: Payer: Self-pay | Admitting: Family Medicine

## 2020-11-03 ENCOUNTER — Ambulatory Visit (HOSPITAL_COMMUNITY)
Admission: RE | Admit: 2020-11-03 | Discharge: 2020-11-03 | Disposition: A | Discharge status: Home / Self Care | Payer: Medicare HMO | Source: Ambulatory Visit | Attending: Nurse Practitioner | Admitting: Nurse Practitioner

## 2020-11-03 ENCOUNTER — Other Ambulatory Visit: Payer: Self-pay | Admitting: *Deleted

## 2020-11-03 ENCOUNTER — Other Ambulatory Visit: Payer: Self-pay

## 2020-11-03 DIAGNOSIS — M25512 Pain in left shoulder: Secondary | ICD-10-CM | POA: Diagnosis not present

## 2020-11-04 ENCOUNTER — Encounter: Payer: Self-pay | Admitting: Nurse Practitioner

## 2020-11-06 ENCOUNTER — Other Ambulatory Visit: Payer: Self-pay | Admitting: Nurse Practitioner

## 2020-11-06 DIAGNOSIS — M25512 Pain in left shoulder: Secondary | ICD-10-CM

## 2020-11-18 ENCOUNTER — Ambulatory Visit (INDEPENDENT_AMBULATORY_CARE_PROVIDER_SITE_OTHER): Payer: Medicare HMO | Admitting: Orthopedic Surgery

## 2020-11-18 ENCOUNTER — Encounter: Payer: Self-pay | Admitting: Orthopedic Surgery

## 2020-11-18 ENCOUNTER — Other Ambulatory Visit: Payer: Self-pay

## 2020-11-18 VITALS — BP 130/69 | HR 78 | Ht 60.0 in | Wt 120.0 lb

## 2020-11-18 DIAGNOSIS — M24512 Contracture, left shoulder: Secondary | ICD-10-CM | POA: Diagnosis not present

## 2020-11-18 NOTE — Progress Notes (Signed)
Patient ID: Susan Liu, female   DOB: [birth year], 64 y.o.   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 Shoulder Pain    Left shoulder pain since September       HPI Susan Liu is a 64 y.o. female. Retired Licensed conveyancer presents with 3-month history of dull pain periacromial region with decreased internal rotation of the left shoulder. Denies trauma. No prior treatment. Notices cannot put her clothes on or reach behind her back. She is an avid swimmer has some significant osteoporosis failed bisphosphonate treatment will see a specialist soon for that    Review of Systems Review of Systems  Musculoskeletal: Positive for back pain, joint pain and neck pain.  All other systems reviewed and are negative.   Past Medical History:  Diagnosis Date  . Heart murmur 1993  . Hyperlipidemia   . Migraine   . Osteoporosis     Past Surgical History:  Procedure Laterality Date  . CESAREAN SECTION     x2  . COLONOSCOPY  2008   Dr.Rourk-normal exam    Family History  Problem Relation Age of Onset  . Cancer Mother        Lung  . Colon polyps Mother   . Heart attack Father   . Colon cancer Neg Hx   . Esophageal cancer Neg Hx   . Rectal cancer Neg Hx   . Stomach cancer Neg Hx      Social History   Tobacco Use  . Smoking status: Never Smoker  . Smokeless tobacco: Never Used  Vaping Use  . Vaping Use: Never used  Substance Use Topics  . Alcohol use: Not Currently  . Drug use: Not Currently    Allergies  Allergen Reactions  . Erythromycin Itching    Allergies  Allergen Reactions  . Erythromycin Itching     Current Meds  Medication Sig  . alendronate (FOSAMAX) 70 MG tablet TAKE 1 TABLET BY MOUTH EVERY 7 DAYS WITHA FULL GLASS OF WATER ON AN EMPTY  STOMACH  . Ascorbic Acid (VITAMIN C PO) Take by mouth.  . Calcium Carb-Cholecalciferol (CALTRATE 600+D3 PO) Take by mouth.  . Creatine Monohydrate POWD by Does not apply route.  . Glucos-Chondroit-Hyaluron-MSM (GLUCOSAMINE CHONDROITIN JOINT PO) Take by mouth.  . LYSINE PO Take by mouth.  . Methylsulfonylmethane (MSM PO) Take by mouth.  . Multiple Vitamins-Minerals (ONE-A-DAY WOMENS PO) Take by mouth.  . Omega-3 Fatty Acids (FISH OIL PO) Take by mouth.  . pravastatin (PRAVACHOL) 80 MG tablet Take 1 tablet (80 mg total) by mouth daily.  . SUMAtriptan (IMITREX) 100 MG tablet TAKE ONE TABLET BY MOUTH EVERY DAY AS NEEDED  . [DISCONTINUED] ibuprofen (ADVIL) 200 MG tablet Take 200 mg by mouth every 6 (six) hours as needed.       Physical Exam BP 130/69   Pulse 78   Ht 5' (1.524 m)   Wt 120 lb (54.4 kg)   BMI 23.44 kg/m   Ambulatory status normal with no assistive devices  GENERAL : APPEARANCE IS NORMAL GROOMING IS GOOD, ectomorphic body habitus  NORMAL MOOD AND AFFECT  AWAKE ALERT AND ORIENTED X 3   Left SHOULDER  TENDERNESS mild anterolateral deltoid tenderness ROM flexion normal abduction normal decreased external rotation and decreased internal rotation only internally  rotating to approximate L3 STABLE ANTERIORLY POSTERIORLY AND INFERIORLY SKIN CLEAN     PROVOCATIVE TESTS  DROP ARM TEST normal PAINFUL ARC internal rotation EMPTY CAN -JOBST TEST negative EXTERNAL ROTATION LAG TEST normal LIFT OFF TEST normal BELLYPRESS TEST  ON THE OTHER SIDE THE RIGHT SHOULDER HAS NORMAL SKIN, NO ROM DEFICITS, EXCELLENT STABILITY, AND NORMAL 5/5 MMT STRENGTH   MEDICAL DECISION MAKING  A.  Encounter Diagnosis  Name Primary?  . Shoulder joint contracture, left Yes    B. DATA ANALYSED:    IMAGING: Independent interpretation of images: Outside images three views of the left shoulder mild glenohumeral joint space narrowing very minimal slight elevation of the humeral head versus  the glenoid in terms of the shoulder Shenton's line mild AC joint arthritis with prominent distal clavicle  Orders: Physical therapy  Outside records reviewed: no C. MANAGEMENT   Injection PT  Procedure note the subacromial injection shoulder left   Verbal consent was obtained to inject the  Left   Shoulder  Timeout was completed to confirm the injection site is a subacromial space of the  left  shoulder  Medication used Depo-Medrol 40 mg and lidocaine 1% 3 cc  Anesthesia was provided by ethyl chloride  The injection was performed in the left  posterior subacromial space. After pinning the skin with alcohol and anesthetized the skin with ethyl chloride the subacromial space was injected using a 20-gauge needle. There were no complications  Sterile dressing was applied.          No orders of the defined types were placed in this encounter.

## 2020-11-18 NOTE — Patient Instructions (Signed)
You have received an injection of steroids into the joint. 15% of patients will have increased pain within the 24 hours postinjection.   This is transient and will go away.   We recommend that you use ice packs on the injection site for 20 minutes every 2 hours and extra strength Tylenol 2 tablets every 8 as needed until the pain resolves.  If you continue to have pain after taking the Tylenol and using the ice please call the office for further instructions.   Therapy should correct it

## 2020-11-25 ENCOUNTER — Encounter: Payer: Self-pay | Admitting: Family Medicine

## 2020-11-25 DIAGNOSIS — Z1211 Encounter for screening for malignant neoplasm of colon: Secondary | ICD-10-CM

## 2020-11-30 ENCOUNTER — Encounter (INDEPENDENT_AMBULATORY_CARE_PROVIDER_SITE_OTHER): Payer: Self-pay | Admitting: *Deleted

## 2020-12-02 ENCOUNTER — Other Ambulatory Visit: Payer: Self-pay | Admitting: Family Medicine

## 2020-12-03 ENCOUNTER — Encounter (HOSPITAL_COMMUNITY): Payer: Self-pay | Admitting: Physical Therapy

## 2020-12-03 ENCOUNTER — Other Ambulatory Visit: Payer: Self-pay

## 2020-12-03 ENCOUNTER — Ambulatory Visit (HOSPITAL_COMMUNITY): Payer: Medicare HMO | Attending: Orthopedic Surgery | Admitting: Occupational Therapy

## 2020-12-03 ENCOUNTER — Ambulatory Visit (HOSPITAL_COMMUNITY): Payer: Medicare HMO | Admitting: Physical Therapy

## 2020-12-03 ENCOUNTER — Encounter (HOSPITAL_COMMUNITY): Payer: Self-pay | Admitting: Occupational Therapy

## 2020-12-03 DIAGNOSIS — M25512 Pain in left shoulder: Secondary | ICD-10-CM | POA: Diagnosis present

## 2020-12-03 DIAGNOSIS — R29898 Other symptoms and signs involving the musculoskeletal system: Secondary | ICD-10-CM | POA: Insufficient documentation

## 2020-12-03 DIAGNOSIS — M6281 Muscle weakness (generalized): Secondary | ICD-10-CM

## 2020-12-03 DIAGNOSIS — M25511 Pain in right shoulder: Secondary | ICD-10-CM | POA: Insufficient documentation

## 2020-12-03 NOTE — Patient Instructions (Signed)
  1) Flexion Wall Stretch    Face wall, place affected handon wall in front of you. Slide hand up the wall  and lean body in towards the wall. Hold for 10 seconds. Repeat 3-5 times. 1-2 times/day.     2) Towel Stretch with Internal Rotation       Gently pull up (or to the side) your affected arm  behind your back with the assist of a towel. Hold 10 seconds, repeat 3-5 times. 1-2 times/day.             3) Corner Stretch    Stand at a corner of a wall, place your arms on the walls with elbows bent. Lean into the corner until a stretch is felt along the front of your chest and/or shoulders. Hold for 10 seconds. Repeat 3-5X, 1-2 times/day.    4) Posterior Capsule Stretch    Bring the involved arm across chest. Grasp elbow and pull toward chest until you feel a stretch in the back of the upper arm and shoulder. Hold 10 seconds. Repeat 3-5X. Complete 1-2 times/day.    5) Scapular Retraction    Tuck chin back as you pinch shoulder blades together.  Hold 5 seconds. Repeat 3-5X. Complete 1-2 times/day.    6) External Rotation Stretch:     Place your affected hand on the wall with the elbow bent and gently turn your body the opposite direction until a stretch is felt. Hold 10 seconds, repeat 3-5X. Complete 1-2 times/day.     7) Shoulder Abduction Stretch:   While standing next to wall, place your affected arm as pictured, then lean towards wall to stretch shoulder. Hold 10 seconds, repeat 3-5X. Complete 1-2 times/day.

## 2020-12-03 NOTE — Therapy (Signed)
[facility] [address] [place], [place], [address] Phone: [phone]   Fax:  [phone]  Physical Therapy Evaluation  Patient Details  Name: Susan Liu MRN: [id number] Date of Birth: 1956-09-01 Referring Provider (PT): Delrae Rend MD   Encounter Date: 12/03/2020   PT End of Session - 12/03/20 1344    Visit Number 1    Number of Visits 3    Date for PT Re-Evaluation 12/18/20    Authorization Type Aetna Medicare    PT Start Time 1305    PT Stop Time 1345    PT Time Calculation (min) 40 min    Activity Tolerance Patient tolerated treatment well    Behavior During Therapy [facility] for tasks assessed/performed           Past Medical History:  Diagnosis Date  . Heart murmur 1993  . Hyperlipidemia   . Migraine   . Osteoporosis     Past Surgical History:  Procedure Laterality Date  . CESAREAN SECTION     x2  . COLONOSCOPY  2008   Dr.Rourk-normal exam    There were no vitals filed for this visit.    Subjective Assessment - 12/03/20 1312    Subjective Patient presents to physical therapy for "osteoporosis therapy". She denies having any functional limitations other some general weakness related to osteoporosis. Patient states she does some exercise and stretching 2 times daily for this already. She is interested in possibly learning more about what she can do to further this endeavor.    Pertinent History osteoporosis    Patient Stated Goals "Wont break any bones"    Currently in Pain? No/denies              [facility] PT Assessment - 12/03/20 0001      Assessment   Medical Diagnosis Osteoporosis    Referring Provider (PT) Delrae Rend MD    Onset Date/Surgical Date --   Chronic   Prior Therapy Yes      Precautions   Precautions None      Restrictions   Weight Bearing Restrictions No      Balance Screen   Has the patient fallen in the past 6 months No      [place]  residence      Prior Function   Level of Independence Independent      Cognition   Overall Cognitive Status Within Functional Limits for tasks assessed      ROM / Strength   AROM / PROM / Strength AROM;Strength      AROM   Overall AROM Comments BLE AROM WFL except mod restriciton in bilateral hip extension AROM      Strength   Strength Assessment Site Hip;Knee    Right/Left Hip Right;Left    Right Hip Flexion 4+/5    Right Hip Extension 4-/5    Right Hip ABduction 4+/5    Left Hip Flexion 4+/5    Left Hip Extension 4-/5    Left Hip ABduction 4+/5    Right/Left Knee Right;Left    Right Knee Extension 4+/5    Left Knee Extension 4+/5                      Objective measurements completed on examination: See above findings.       [facility] Adult PT Treatment/Exercise - 12/03/20 0001      Exercises   Exercises Lumbar  Lumbar Exercises: Supine   Other Supine Lumbar Exercises Lumbar decompression 1-5 x 5 each                  PT Education - 12/03/20 1314    Education Details on evaluation findings and HEP    Person(s) Educated Patient    Methods Explanation;Handout    Comprehension Verbalized understanding            PT Short Term Goals - 12/03/20 1641      PT SHORT TERM GOAL #1   Title Patient will be independent with comprehensive HEP and self-management strategies to improve functional outcomes    Time 3    Period Weeks    Status New    Target Date 12/25/20             PT Long Term Goals - 12/03/20 1641      PT LONG TERM GOAL #1   Title =STG                  Plan - 12/03/20 1636    Clinical Impression Statement Patient is a 65 y.o. female who presents to physical therapy with complaint of general weakness related to osteoporosis. Patient demonstrates mild decreased strength, flexibility restrictions and decreased self-perceived functional ability which are negatively impacting patient ability to perform ADLs and  functional mobility tasks. Patient will benefit from skilled physical therapy services to address these deficits by developing weekly HEP to improve level of function with ADLs and functional mobility tasks    Personal Factors and Comorbidities Comorbidity 1    Comorbidities osteoporosis    Examination-Activity Limitations Locomotion Level;Other;Bend    Examination-Participation Restrictions Cleaning;Yard Work;Community Activity    Stability/Clinical Decision Making Stable/Uncomplicated    Clinical Decision Making Low    Rehab Potential Good    PT Frequency 1x / week    PT Duration 3 weeks    PT Treatment/Interventions ADLs/Self Care Home Management;Aquatic Therapy;Biofeedback;Cryotherapy;Ultrasound;Traction;Functional mobility training;Patient/family education;Therapeutic activities;Parrafin;Fluidtherapy;Manual techniques;Manual lymph drainage;Dry needling;Energy conservation;Splinting;Spinal Manipulations;Visual/perceptual remediation/compensation;Passive range of motion;Scar mobilization;Vestibular;Neuromuscular re-education;Stair training;Gait training;Balance training;Therapeutic exercise;Orthotic Fit/Training;Contrast Bath;DME Instruction;Electrical Stimulation;Iontophoresis 4mg /ml Dexamethasone;Moist Heat;Compression bandaging;Taping;Vasopneumatic Device;Joint Manipulations    PT Next Visit Plan Review goals and HEP. Add band resistance UE decompression exercises. Add stretch for hip extension flexibility but avoid lumbar compression forces. Advance to higher level core isometrics when able with focus on weekly HEP development.    PT Home Exercise Plan Eval: lumbar decompression 1-5    Consulted and Agree with Plan of Care Patient           Patient will benefit from skilled therapeutic intervention in order to improve the following deficits and impairments:  Impaired flexibility,Decreased strength,Impaired perceived functional ability  Visit Diagnosis: Muscle weakness  (generalized)     Problem List Patient Active Problem List   Diagnosis Date Noted  . Vasovagal near syncope 06/12/2018  . Trochanteric bursitis, right hip 02/16/2017  . Osteoporosis 06/17/2016  . Atypical pigmented skin lesion 05/21/2015  . 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 Spasm of muscle 07/29/2014  . Stiffness of joints, not elsewhere classified, multiple sites 07/29/2014  . Osteopenia 06/05/2014  . Cervical nerve root compression 06/05/2014  . Osteoporosis, unspecified 05/24/2013  . Hyperlipemia 05/24/2013  . Migraines 05/03/2013   4:43 PM, 12/03/20 Georges Lynchameron Yaeli Hartung PT DPT  Physical Therapist with [facility]  [facility]  [phone]   [facility] [address] [place], [place], [address] Phone: [phone]   Fax:  [phone]  Name: KARMYN LOWMAN MRN: [id number] Date of Birth: Apr 18, 1956

## 2020-12-03 NOTE — Therapy (Signed)
[facility] [address] [place], [place], [address] Phone: [phone]   Fax:  [phone]  Occupational Therapy Evaluation  Patient Details  Name: Susan Liu MRN: [id number] Date of Birth: November 12, 1956 Referring Provider (OT): Dr. Arther Abbott   Encounter Date: 12/03/2020   OT End of Session - 12/03/20 1522    Visit Number 1    Number of Visits 4    Date for OT Re-Evaluation 01/02/21    Authorization Type Aetna Medicare; $25 copay    Authorization Time Period no visit limit    Progress Note Due on Visit 10    OT Start Time 1348    OT Stop Time 1425    OT Time Calculation (min) 37 min    Activity Tolerance Patient tolerated treatment well    Behavior During Therapy [facility] for tasks assessed/performed           Past Medical History:  Diagnosis Date  . Heart murmur 1993  . Hyperlipidemia   . Migraine   . Osteoporosis     Past Surgical History:  Procedure Laterality Date  . CESAREAN SECTION     x2  . COLONOSCOPY  2008   Dr.Rourk-normal exam    There were no vitals filed for this visit.   Subjective Assessment - 12/03/20 1520    Subjective  S: It's gotten better since I got the shot.    Pertinent History Pt is a 65 y/o female presenting with left shoulder pain present for approximately 3 months. Pt reports improvement since cortisone shot and now is mostly having pain at her end ROM. Pt was referred to occupational therapy for evaluation and treatment by Dr. Arther Abbott    Special Tests FOTO: 55/100    Patient Stated Goals To have less pain    Currently in Pain? No/denies             [facility] OT Assessment - 12/03/20 1350      Assessment   Medical Diagnosis left shoulder pain    Referring Provider (OT) Dr. Arther Abbott    Onset Date/Surgical Date 08/12/20    Hand Dominance Right    Next MD Visit 01/02/21    Prior Therapy None for this      Precautions   Precautions None      Restrictions   Weight  Bearing Restrictions No      Balance Screen   Has the patient fallen in the past 6 months Yes    How many times? 2    Has the patient had a decrease in activity level because of a fear of falling?  No    Is the patient reluctant to leave their home because of a fear of falling?  No      Prior Function   Level of Independence Independent    Vocation Retired    Leisure cooking, Academic librarian      ADL   ADL comments Pt is having difficulty with end range motion with reaching out and up, behind back. Is unable to lay with arm behind head. Difficulty sleeping at times.      Written Expression   Dominant Hand Right      Cognition   Overall Cognitive Status Within Functional Limits for tasks assessed      Observation/Other Assessments   Focus on Therapeutic Outcomes (FOTO)  55/100      ROM / Strength   AROM / PROM / Strength AROM;PROM;Strength  Palpation   Palpation comment mod fascial restrictions in upper arm, trapezius, and scapular regions      AROM   Overall AROM Comments Assessed seated, er/IR adducted    AROM Assessment Site Shoulder    Right/Left Shoulder Left    Left Shoulder Flexion 150 Degrees    Left Shoulder ABduction 140 Degrees    Left Shoulder Internal Rotation 90 Degrees    Left Shoulder External Rotation 52 Degrees      PROM   Overall PROM Comments P/ROM is [facility]      Strength   Overall Strength Comments Assessed seated, er/IR adducted    Strength Assessment Site Shoulder    Right/Left Shoulder Left    Left Shoulder Flexion 5/5    Left Shoulder ABduction 4+/5    Left Shoulder Internal Rotation 5/5    Left Shoulder External Rotation 4/5                           OT Education - 12/03/20 1410    Education Details shoulder stretches    Person(s) Educated Patient    Methods Explanation;Demonstration;Handout    Comprehension Verbalized understanding;Returned demonstration            OT Short Term Goals - 12/03/20 1542      OT SHORT  TERM GOAL #1   Title Pt will be provided with and educated on HEP to improve mobility required for ADLs.    Time 4    Period Weeks    Status New    Target Date 01/02/21      OT SHORT TERM GOAL #2   Title Pt will increase LUE A/ROM to WNL to improve ability to reach behind back.    Time 4    Period Weeks    Status New      OT SHORT TERM GOAL #3   Title Pt will decrease LUE pain to 2/10 or less to improve ability to sleep on left side as needed.    Time 4    Period Weeks    Status New      OT SHORT TERM GOAL #4   Title Pt will decrease LUE fascial restrictions to minimal amounts to improve mobility required for functional reaching.    Time 4    Period Weeks    Status New      OT SHORT TERM GOAL #5   Title Pt will increase RUE strength to 5/5 throughout to improve ability to lift and hold grandchildren.    Time 4    Period Weeks    Status New                    Plan - 12/03/20 1523    Clinical Impression Statement A: Pt is a 65 y/o female presenting with left shoulder pain limiting participation in functional tasks. Pt reports significant improvement in pain since receiving cortisone shot, continues to have pain at end ROM. Educated on HEP and expectations of soreness.    OT Occupational Profile and History Problem Focused Assessment - Including review of records relating to presenting problem    Occupational performance deficits (Please refer to evaluation for details): ADL's;IADL's;Leisure    Body Structure / Function / Physical Skills ADL;UE functional use;Fascial restriction;Pain;ROM;IADL;Strength    Rehab Potential Good    Clinical Decision Making Limited treatment options, no task modification necessary    Comorbidities Affecting Occupational Performance: None    Modification or Assistance  to Complete Evaluation  No modification of tasks or assist necessary to complete eval    OT Frequency 1x / week    OT Duration 4 weeks    OT Treatment/Interventions  Self-care/ADL training;Ultrasound;Patient/family education;Passive range of motion;Cryotherapy;Electrical Stimulation;Moist Heat;Therapeutic exercise;Manual Therapy;Therapeutic activities    Plan P: Pt will benefit from skilled OT services to decrease pain and fascial restrictions, increase joint ROM, strength, and functional use of LUE. Treatment plan: myofascial release, manual techniques, P/ROM, A/ROM, general RUE strengthening, modalities prn    OT Home Exercise Plan eval 1/6: shoulder stretches    Consulted and Agree with Plan of Care Patient           Patient will benefit from skilled therapeutic intervention in order to improve the following deficits and impairments:   Body Structure / Function / Physical Skills: ADL,UE functional use,Fascial restriction,Pain,ROM,IADL,Strength       Visit Diagnosis: Acute pain of right shoulder  Other symptoms and signs involving the musculoskeletal system    Problem List Patient Active Problem List   Diagnosis Date Noted  . Vasovagal near syncope 06/12/2018  . Trochanteric bursitis, right hip 02/16/2017  . Osteoporosis 06/17/2016  . Atypical pigmented skin lesion 05/21/2015  . 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 Spasm of muscle 07/29/2014  . Stiffness of joints, not elsewhere classified, multiple sites 07/29/2014  . Osteopenia 06/05/2014  . Cervical nerve root compression 06/05/2014  . Osteoporosis, unspecified 05/24/2013  . Hyperlipemia 05/24/2013  . Migraines 05/03/2013   Ezra Sites, OTR/L  [phone] 12/03/2020, 3:55 PM  [facility] [address] [place], [place], [address] Phone: [phone]   Fax:  [phone]  Name: SAMANTA GAL MRN: [id number] Date of Birth: July 25, 1956

## 2020-12-08 ENCOUNTER — Other Ambulatory Visit: Payer: Self-pay

## 2020-12-08 ENCOUNTER — Encounter (HOSPITAL_COMMUNITY): Payer: Self-pay

## 2020-12-08 ENCOUNTER — Ambulatory Visit (HOSPITAL_COMMUNITY): Payer: Medicare HMO

## 2020-12-08 DIAGNOSIS — R29898 Other symptoms and signs involving the musculoskeletal system: Secondary | ICD-10-CM | POA: Diagnosis not present

## 2020-12-08 DIAGNOSIS — M6281 Muscle weakness (generalized): Secondary | ICD-10-CM

## 2020-12-08 NOTE — Therapy (Signed)
[facility] [place], [place], [address] Phone: [phone]   Fax:  [phone]  Physical Therapy Treatment  Patient Details  Name: Susan Liu MRN: [id number] Date of Birth: June 26, 1956 Referring Provider (PT): Delrae Rend MD   Encounter Date: 12/08/2020   PT End of Session - 12/08/20 1145    Visit Number 2    Number of Visits 3    Date for PT Re-Evaluation 12/25/20    Authorization Type Aetna Medicare    PT Start Time 1132    PT Stop Time 1210    PT Time Calculation (min) 38 min    Activity Tolerance Patient tolerated treatment well;No increased pain    Behavior During Therapy [facility] for tasks assessed/performed           Past Medical History:  Diagnosis Date  . Heart murmur 1993  . Hyperlipidemia   . Migraine   . Osteoporosis     Past Surgical History:  Procedure Laterality Date  . CESAREAN SECTION     x2  . COLONOSCOPY  2008   Dr.Rourk-normal exam    There were no vitals filed for this visit.   Subjective Assessment - 12/08/20 1137    Subjective Pt reports she HEP irritates lower back some, may possibly be stress, weather or other issues.  Arrived wiht icyhot patch on back, pain scale 3/10 today.    Currently in Pain? Yes    Pain Score 3     Pain Location Back    Pain Orientation Lower    Pain Descriptors / Indicators Aching;Dull    Pain Type Chronic pain    Pain Onset More than a month ago    Pain Frequency Intermittent    Aggravating Factors  stress, certain movements/ exercises, sneezing    Pain Relieving Factors IcyHotPatch, Ice                             [facility] Adult PT Treatment/Exercise - 12/08/20 0001      Bed Mobility   Bed Mobility Sit to Sidelying Right    Sit to Sidelying Right Supervision/Verbal cueing   Reviewed log rolling for proper mechanics to reduce strain lumbar     Exercises   Exercises Lumbar      Lumbar Exercises: Standing   Other Standing Lumbar  Exercises hip extension      Lumbar Exercises: Supine   Other Supine Lumbar Exercises Lumbar decompression 2-5 x 5 each 3"    Other Supine Lumbar Exercises RTB decompression 1-4 10x3" each                  PT Education - 12/08/20 1149    Education Details Reviewed goals, educated importance of compliance with HEP for maximal benefits, reviewed current HEP this session to assure correct mechanics.    Person(s) Educated Patient    Methods Explanation    Comprehension Verbalized understanding;Returned demonstration            PT Short Term Goals - 12/03/20 1641      PT SHORT TERM GOAL #1   Title Patient will be independent with comprehensive HEP and self-management strategies to improve functional outcomes    Time 3    Period Weeks    Status New    Target Date 12/25/20             PT Long Term Goals - 12/03/20 1641  PT LONG TERM GOAL #1   Title =STG                 Plan - 12/08/20 1157    Clinical Impression Statement Reviewed goals, educated importance of HEP compliance for maximal benefits iwth therapy.   Reviewed current decompression exercises with min cueing for proper form, encouraged pt to reduce force and more relaxing exercises for pain control.  Pt able to complete exercise with no report of increased pain following cueing.  Progressed to decompression exercise wiht theraband for postural strengthening, able to complete with good form and mechanics.  Given additional theraband and printout for advanced HEP.    Personal Factors and Comorbidities Comorbidity 1    Comorbidities osteoporosis    Examination-Activity Limitations Locomotion Level;Other;Bend    Examination-Participation Restrictions Cleaning;Yard Work;Community Activity    Stability/Clinical Decision Making Stable/Uncomplicated    Clinical Decision Making Low    Rehab Potential Good    PT Frequency 1x / week    PT Duration 3 weeks    PT Treatment/Interventions ADLs/Self Care Home  Management;Aquatic Therapy;Biofeedback;Cryotherapy;Ultrasound;Traction;Functional mobility training;Patient/family education;Therapeutic activities;Parrafin;Fluidtherapy;Manual techniques;Manual lymph drainage;Dry needling;Energy conservation;Splinting;Spinal Manipulations;Visual/perceptual remediation/compensation;Passive range of motion;Scar mobilization;Vestibular;Neuromuscular re-education;Stair training;Gait training;Balance training;Therapeutic exercise;Orthotic Fit/Training;Contrast Bath;DME Instruction;Electrical Stimulation;Iontophoresis 4mg /ml Dexamethasone;Moist Heat;Compression bandaging;Taping;Vasopneumatic Device;Joint Manipulations    PT Next Visit Plan F/U wiht new HEP, answer questions PRN.  Add stretch for hip extension flexibility but avoid lumbar compression forces. Advance to higher level core isometrics when able with focus on weekly HEP development.    PT Home Exercise Plan Eval: lumbar decompression 1-5; 12/08/20: RTB decompression           Patient will benefit from skilled therapeutic intervention in order to improve the following deficits and impairments:  Impaired flexibility,Decreased strength,Impaired perceived functional ability  Visit Diagnosis: Other symptoms and signs involving the musculoskeletal system  Muscle weakness (generalized)     Problem List Patient Active Problem List   Diagnosis Date Noted  . Vasovagal near syncope 06/12/2018  . Trochanteric bursitis, right hip 02/16/2017  . Osteoporosis 06/17/2016  . Atypical pigmented skin lesion 05/21/2015  . Diastematomyelia ([place]) 12/24/2014  . Spasm of muscle 07/29/2014  . Stiffness of joints, not elsewhere classified, multiple sites 07/29/2014  . Osteopenia 06/05/2014  . Cervical nerve root compression 06/05/2014  . Osteoporosis, unspecified 05/24/2013  . Hyperlipemia 05/24/2013  . Migraines 05/03/2013   Ihor Austin, LPTA/CLT; CBIS [phone]  Aldona Lento 12/08/2020, 1:30 PM  [facility] [address] [place], [place], [address] Phone: [phone]   Fax:  [phone]  Name: Susan Liu MRN: [id number] Date of Birth: 02-08-1956

## 2020-12-10 ENCOUNTER — Ambulatory Visit (HOSPITAL_COMMUNITY): Payer: Medicare HMO | Admitting: Occupational Therapy

## 2020-12-10 ENCOUNTER — Other Ambulatory Visit: Payer: Self-pay

## 2020-12-10 DIAGNOSIS — M25512 Pain in left shoulder: Secondary | ICD-10-CM

## 2020-12-10 DIAGNOSIS — R29898 Other symptoms and signs involving the musculoskeletal system: Secondary | ICD-10-CM

## 2020-12-10 NOTE — Therapy (Signed)
[facility] [address] [place], [place], [address] Phone: [phone]   Fax:  [phone]  Occupational Therapy Treatment  Patient Details  Name: Susan Liu MRN: [id number] Date of Birth: [birth year] Referring Provider (OT): Dr. Arther Abbott   Encounter Date: 12/10/2020   OT End of Session - 12/10/20 1202    Visit Number 2    Number of Visits 4    Date for OT Re-Evaluation 01/02/21    Authorization Type [employer]; $25 copay    Authorization Time Period no visit limit    Progress Note Due on Visit 10    OT Start Time 1121    OT Stop Time 1200    OT Time Calculation (min) 39 min    Activity Tolerance Patient tolerated treatment well    Behavior During Therapy [facility] for tasks assessed/performed           Past Medical History:  Diagnosis Date  . Heart murmur 1993  . Hyperlipidemia   . Migraine   . Osteoporosis     Past Surgical History:  Procedure Laterality Date  . CESAREAN SECTION     x2  . COLONOSCOPY  2008   Dr.Rourk-normal exam    There were no vitals filed for this visit.   Subjective Assessment - 12/10/20 1124    Subjective  S: It's feeling great.    Currently in Pain? No/denies              [facility] OT Assessment - 12/10/20 1124      Assessment   Medical Diagnosis left shoulder pain      Precautions   Precautions None                    OT Treatments/Exercises (OP) - 12/10/20 1135      Exercises   Exercises Shoulder      Shoulder Exercises: Supine   Protraction PROM;5 reps    Horizontal ABduction PROM;5 reps    External Rotation PROM;5 reps    Internal Rotation PROM;5 reps    Flexion PROM;5 reps    ABduction PROM;5 reps      Shoulder Exercises: Standing   Protraction Theraband;10 reps    Theraband Level (Shoulder Protraction) Level 2 (Red)    Horizontal ABduction Theraband;10 reps    Theraband Level (Shoulder Horizontal ABduction) Level 2 (Red)    External Rotation  Theraband;10 reps    Theraband Level (Shoulder External Rotation) Level 2 (Red)    Internal Rotation Theraband;10 reps    Theraband Level (Shoulder Internal Rotation) Level 2 (Red)    Flexion Theraband;10 reps    Theraband Level (Shoulder Flexion) Level 2 (Red)    ABduction Theraband;10 reps    Theraband Level (Shoulder ABduction) Level 2 (Red)      Shoulder Exercises: Therapy Ball   Other Therapy Ball Exercises green therapy ball: chest press, flexion, circles each direction      Shoulder Exercises: ROM/Strengthening   X to V Arms 10X each    Ball on Wall 1' flexion, 1' abduction      Manual Therapy   Manual Therapy Myofascial release    Manual therapy comments completed separately from therapeutic exercises    Myofascial Release myofascial release and manual techniques to left upper arm, trapezius, and scapular regions to decrease pain and fascial restrictions and increase joint ROM                  OT Education - 12/10/20  1146    Education Details red theraband strengthening-standing    Person(s) Educated Patient    Methods Explanation;Demonstration;Handout    Comprehension Verbalized understanding;Returned demonstration            OT Short Term Goals - 12/03/20 1542      OT SHORT TERM GOAL #1   Title Pt will be provided with and educated on HEP to improve mobility required for ADLs.    Time 4    Period Weeks    Status New    Target Date 01/02/21      OT SHORT TERM GOAL #2   Title Pt will increase LUE A/ROM to WNL to improve ability to reach behind back.    Time 4    Period Weeks    Status New      OT SHORT TERM GOAL #3   Title Pt will decrease LUE pain to 2/10 or less to improve ability to sleep on left side as needed.    Time 4    Period Weeks    Status New      OT SHORT TERM GOAL #4   Title Pt will decrease LUE fascial restrictions to minimal amounts to improve mobility required for functional reaching.    Time 4    Period Weeks    Status New       OT SHORT TERM GOAL #5   Title Pt will increase RUE strength to 5/5 throughout to improve ability to lift and hold grandchildren.    Time 4    Period Weeks    Status New                    Plan - 12/10/20 1202    Clinical Impression Statement A: Initiated myofascial release to address fascial restrictions in LUE, good response to manual therapy. Pt with min discomfort during er stretch. Pt completing shoulder strengthening and stability work with therapy ball, ball on wall, and standing resistance band strengthening. Reviewed er stretch on current HEP. Verbal cuing for form and technique during tasks.    Body Structure / Function / Physical Skills ADL;UE functional use;Fascial restriction;Pain;ROM;IADL;Strength    Plan P: Follow up on er stretch and theraband strengthening HEP, continue with shoulder stability and strengthening tasks-add overhead lacing, Y lift off    OT Home Exercise Plan eval 1/6: shoulder stretches; 1/13: standing red theraband strengthening    Consulted and Agree with Plan of Care Patient           Patient will benefit from skilled therapeutic intervention in order to improve the following deficits and impairments:   Body Structure / Function / Physical Skills: ADL,UE functional use,Fascial restriction,Pain,ROM,IADL,Strength       Visit Diagnosis: Other symptoms and signs involving the musculoskeletal system  Acute pain of left shoulder    Problem List Patient Active Problem List   Diagnosis Date Noted  . Vasovagal near syncope 06/12/2018  . Trochanteric bursitis, right hip 02/16/2017  . Osteoporosis 06/17/2016  . Atypical pigmented skin lesion 05/21/2015  . 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 Spasm of muscle 07/29/2014  . Stiffness of joints, not elsewhere classified, multiple sites 07/29/2014  . Osteopenia 06/05/2014  . Cervical nerve root compression 06/05/2014  . Osteoporosis, unspecified 05/24/2013  . Hyperlipemia 05/24/2013  .  Migraines 05/03/2013    Guadelupe Sabin, OTR/L  [phone] 12/10/2020, 12:04 PM  [facility] [address] [place], [place], [address] Phone: [phone]   Fax:  [phone]  Name: Susan Liu MRN: [id number] Date of Birth: 06-28-1956

## 2020-12-10 NOTE — Patient Instructions (Signed)

## 2020-12-16 ENCOUNTER — Other Ambulatory Visit: Payer: Self-pay

## 2020-12-16 ENCOUNTER — Encounter (HOSPITAL_COMMUNITY): Payer: Self-pay | Admitting: Physical Therapy

## 2020-12-16 ENCOUNTER — Ambulatory Visit (HOSPITAL_COMMUNITY): Payer: Medicare HMO

## 2020-12-16 ENCOUNTER — Ambulatory Visit (HOSPITAL_COMMUNITY): Payer: Medicare HMO | Admitting: Physical Therapy

## 2020-12-16 ENCOUNTER — Encounter (HOSPITAL_COMMUNITY): Payer: Self-pay

## 2020-12-16 DIAGNOSIS — R29898 Other symptoms and signs involving the musculoskeletal system: Secondary | ICD-10-CM

## 2020-12-16 DIAGNOSIS — M25512 Pain in left shoulder: Secondary | ICD-10-CM

## 2020-12-16 DIAGNOSIS — M6281 Muscle weakness (generalized): Secondary | ICD-10-CM

## 2020-12-16 NOTE — Therapy (Signed)
[facility] [place], [place], [address] Phone: [phone]   Fax:  [phone]  Occupational Therapy Treatment  Patient Details  Name: Susan Liu MRN: [id number] Date of Birth: 21-Jun-1956 Referring Provider (OT): Dr. Arther Abbott   Encounter Date: 12/16/2020   OT End of Session - 12/16/20 1813    Visit Number 3    Number of Visits 4    Date for OT Re-Evaluation 01/02/21    Authorization Type [employer] Medicare; $25 copay    Authorization Time Period no visit limit    Progress Note Due on Visit 10    OT Start Time 1437    OT Stop Time 1515    OT Time Calculation (min) 38 min    Activity Tolerance Patient tolerated treatment well    Behavior During Therapy [facility] for tasks assessed/performed           Past Medical History:  Diagnosis Date  . Heart murmur 1993  . Hyperlipidemia   . Migraine   . Osteoporosis     Past Surgical History:  Procedure Laterality Date  . CESAREAN SECTION     x2  . COLONOSCOPY  2008   Dr.Rourk-normal exam    There were no vitals filed for this visit.   Subjective Assessment - 12/16/20 1811    Subjective  S: it doesn't hurt unless I move it in a way that makes it hurt.    Currently in Pain? No/denies              [facility] OT Assessment - 12/16/20 1811      Assessment   Medical Diagnosis Left shoulder pain      Precautions   Precautions None                    OT Treatments/Exercises (OP) - 12/16/20 1457      Exercises   Exercises Shoulder      Shoulder Exercises: Supine   Protraction PROM;5 reps    Horizontal ABduction PROM;5 reps    External Rotation PROM;5 reps   abducted   Internal Rotation PROM;5 reps   abducted   Flexion PROM;5 reps    ABduction PROM;5 reps      Shoulder Exercises: ROM/Strengthening   Over Head Lace seated 2'    X to V Arms 10X each    Other ROM/Strengthening Exercises Y arm lift off; 10X      Shoulder Exercises: Stretch    External Rotation Stretch 1 rep;30 seconds    Star Gazer Stretch 1 rep;30 seconds      Manual Therapy   Manual Therapy Myofascial release    Manual therapy comments completed separately from therapeutic exercises    Myofascial Release myofascial release and manual techniques to left upper arm, trapezius, and scapular regions to decrease pain and fascial restrictions and increase joint ROM                  OT Education - 12/16/20 1505    Education Details reviewed external rotation stretch using doorframe with adjustments and recommendations provided. Stargazer stretch introduced supine. Provided ball on the wall and X to V arms for completion at home per patient's request.    Person(s) Educated Patient    Methods Explanation;Demonstration    Comprehension Verbalized understanding;Returned demonstration            OT Short Term Goals - 12/16/20 1817      OT SHORT TERM GOAL #1  Title Pt will be provided with and educated on HEP to improve mobility required for ADLs.    Time 4    Period Weeks    Status On-going    Target Date 01/02/21      OT SHORT TERM GOAL #2   Title Pt will increase LUE A/ROM to WNL to improve ability to reach behind back.    Time 4    Period Weeks    Status On-going      OT SHORT TERM GOAL #3   Title Pt will decrease LUE pain to 2/10 or less to improve ability to sleep on left side as needed.    Time 4    Period Weeks    Status Achieved      OT SHORT TERM GOAL #4   Title Pt will decrease LUE fascial restrictions to minimal amounts to improve mobility required for functional reaching.    Time 4    Period Weeks    Status Achieved      OT SHORT TERM GOAL #5   Title Pt will increase RUE strength to 5/5 throughout to improve ability to lift and hold grandchildren.    Time 4    Period Weeks    Status On-going                    Plan - 12/16/20 1813    Clinical Impression Statement A: Manual techniques completed to address any  fascial restrictions in the LUE. No fascial restrictions noted during today's sessions. Reviewed External rotation stretch from HEP with adjustments and suggestions made. Added Y arms lift off and overhead lacing for scapular stability. Pt completed with no complaints or difficulty. No pain reported during session. VC for form and technique were provided during session. Focused on external rotation during passive stretching and patient's restriction in mobility appears to be joint related and not muscle due to hard end feeling of stretch.    Body Structure / Function / Physical Skills ADL;UE functional use;Fascial restriction;Pain;ROM;IADL;Strength    Plan P: Progress to low weight strengthening. UBE bike.    OT Home Exercise Plan eval 1/6: shoulder stretches; 1/13: standing red theraband strengthening 1/19: ball on the wall, X to V arms    Consulted and Agree with Plan of Care Patient           Patient will benefit from skilled therapeutic intervention in order to improve the following deficits and impairments:   Body Structure / Function / Physical Skills: ADL,UE functional use,Fascial restriction,Pain,ROM,IADL,Strength       Visit Diagnosis: Other symptoms and signs involving the musculoskeletal system  Acute pain of left shoulder  Muscle weakness (generalized)    Problem List Patient Active Problem List   Diagnosis Date Noted  . Vasovagal near syncope 06/12/2018  . Trochanteric bursitis, right hip 02/16/2017  . Osteoporosis 06/17/2016  . Atypical pigmented skin lesion 05/21/2015  . 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 Spasm of muscle 07/29/2014  . Stiffness of joints, not elsewhere classified, multiple sites 07/29/2014  . Osteopenia 06/05/2014  . Cervical nerve root compression 06/05/2014  . Osteoporosis, unspecified 05/24/2013  . Hyperlipemia 05/24/2013  . Migraines 05/03/2013   Ailene Ravel, OTR/L,CBIS  [phone]  12/16/2020, 6:18 PM  [facility] [address] [place], [place], [address] Phone: [phone]   Fax:  [phone]  Name: Susan Liu MRN: [id number] Date of Birth: March 22, 1956

## 2020-12-16 NOTE — Patient Instructions (Signed)
Complete the following exercises once a day.  Theraball Serratus Lateral wall circles   . Full arm extension   . Complete clockwise and counter clockwise circles for 1 minute  . Ribs down, maintain core activation      Theraball Serratus Anterior wall circles   . Full arm extension   . Complete clockwise and counter clockwise circles for 1 minute  . Ribs down, maintain core activation        X to V arms (cheerleader move):  Begin with arms straight down, crossed in front of body in an "X". Keeping arms crossed, lift arms straight up overhead. Then spread arms apart into a "V" shape.  Bring back together into x and lower down to starting position. Complete 12-15 times. 1 set

## 2020-12-16 NOTE — Therapy (Signed)
[facility] [address] [place], [place], [address] Phone: [phone]   Fax:  [phone]  Physical Therapy Treatment  Patient Details  Name: Susan Liu MRN: [id number] Date of Birth: [birth year] Referring Provider (PT): Delrae Rend MD  PHYSICAL THERAPY DISCHARGE SUMMARY  Visits from Start of Care: 3  Current functional level related to goals / functional outcomes: See below    Remaining deficits: See below    Education / Equipment: See assessment  Plan: Patient agrees to discharge.  Patient goals were met. Patient is being discharged due to meeting the stated rehab goals.  ?????       Encounter Date: 12/16/2020   PT End of Session - 12/16/20 1317    Visit Number 3    Number of Visits 3    Date for PT Re-Evaluation 12/25/20    Authorization Type [employer] Medicare    PT Start Time 1305    PT Stop Time 1350    PT Time Calculation (min) 45 min    Activity Tolerance Patient tolerated treatment well    Behavior During Therapy [facility] for tasks assessed/performed           Past Medical History:  Diagnosis Date  . Heart murmur 1993  . Hyperlipidemia   . Migraine   . Osteoporosis     Past Surgical History:  Procedure Laterality Date  . CESAREAN SECTION     x2  . COLONOSCOPY  2008   Dr.Rourk-normal exam    There were no vitals filed for this visit.   Subjective Assessment - 12/16/20 1317    Subjective Patient says she feels better. She says she was sore with initial exercises but thinks she was doing them too forcefully. She says she lightened up a bit and felt fine with these. Feels these have been helpful additions.    Currently in Pain? No/denies    Pain Onset More than a month ago              [facility] PT Assessment - 12/16/20 0001      Assessment   Medical Diagnosis Osteoporosis    Referring Provider (PT) Delrae Rend MD      Precautions   Precautions --   osteoporosis     Restrictions   Weight  Bearing Restrictions No      Home Environment   Living Environment Private residence      Prior Function   Level of Independence Independent      Cognition   Overall Cognitive Status Within Functional Limits for tasks assessed                         [facility] Adult PT Treatment/Exercise - 12/16/20 0001      Lumbar Exercises: Stretches   Other Lumbar Stretch Exercise thomas stretch, cues for TA activation and neutral spine 2 x 30" hold      Lumbar Exercises: Standing   Other Standing Lumbar Exercises palloff press GTB x 10 each, palloff walkout GTB x 10 each    Other Standing Lumbar Exercises chair squat, cues for neutral spine x10                    PT Short Term Goals - 12/16/20 1409      PT SHORT TERM GOAL #1   Title Patient will be independent with comprehensive HEP and self-management strategies to improve functional outcomes    Baseline Reports compliance, demos  good return, reviewed and issued comprehensive HEP handout    Time 3    Period Weeks    Status Achieved    Target Date 12/25/20             PT Long Term Goals - 12/03/20 1641      PT LONG TERM GOAL #1   Title =STG                 Plan - 12/16/20 1404    Clinical Impression Statement Patient has progressed well and currently reports she is ready for DC after todays session. Reviewed HEP and instructed patient on core strength progressions using resistance bands. Discussed DC from therapy with transition to comprehensive HEP. Patient educated on importance of grading activity and frequency of strength program in combination with walking program and water aerobics. Answered all patient questions. Patient issued updated HEP handout and instructed to follow up with therapy services with any further questions or concerns.    Personal Factors and Comorbidities Comorbidity 1    Comorbidities osteoporosis    Examination-Activity Limitations Locomotion Level;Other;Bend     Examination-Participation Restrictions Cleaning;Yard Work;Community Activity    Stability/Clinical Decision Making Stable/Uncomplicated    Rehab Potential Good    PT Frequency 1x / week    PT Duration 3 weeks    PT Treatment/Interventions ADLs/Self Care Home Management;Aquatic Therapy;Biofeedback;Cryotherapy;Ultrasound;Traction;Functional mobility training;Patient/family education;Therapeutic activities;Parrafin;Fluidtherapy;Manual techniques;Manual lymph drainage;Dry needling;Energy conservation;Splinting;Spinal Manipulations;Visual/perceptual remediation/compensation;Passive range of motion;Scar mobilization;Vestibular;Neuromuscular re-education;Stair training;Gait training;Balance training;Therapeutic exercise;Orthotic Fit/Training;Contrast Bath;DME Instruction;Electrical Stimulation;Iontophoresis 53m/ml Dexamethasone;Moist Heat;Compression bandaging;Taping;Vasopneumatic Device;Joint Manipulations    PT Next Visit Plan DC to HEP    PT Home Exercise Plan Eval: lumbar decompression 1-5; 12/08/20: RTB decompression 1/19 palloff press, palloff walkouts, chair squats, thomas stretch    Consulted and Agree with Plan of Care Patient           Patient will benefit from skilled therapeutic intervention in order to improve the following deficits and impairments:  Impaired flexibility,Decreased strength,Impaired perceived functional ability  Visit Diagnosis: Other symptoms and signs involving the musculoskeletal system  Acute pain of left shoulder  Muscle weakness (generalized)     Problem List Patient Active Problem List   Diagnosis Date Noted  . Vasovagal near syncope 06/12/2018  . Trochanteric bursitis, right hip 02/16/2017  . Osteoporosis 06/17/2016  . Atypical pigmented skin lesion 05/21/2015  . [place] 12/24/2014  . Spasm of muscle 07/29/2014  . Stiffness of joints, not elsewhere classified, multiple sites 07/29/2014  . Osteopenia 06/05/2014  . Cervical nerve root  compression 06/05/2014  . Osteoporosis, unspecified 05/24/2013  . Hyperlipemia 05/24/2013  . Migraines 05/03/2013   2:11 PM, 12/16/20 CJosue HectorPT DPT  [facility] [phone] [address]Phone: [phone]  Fax:  [phone] Name: Susan MELFIMRN: [id number] of Birth: 108-21-1957

## 2020-12-23 ENCOUNTER — Encounter (HOSPITAL_COMMUNITY): Payer: Self-pay | Admitting: Occupational Therapy

## 2020-12-23 ENCOUNTER — Ambulatory Visit (HOSPITAL_COMMUNITY): Payer: Medicare HMO | Admitting: Occupational Therapy

## 2020-12-23 ENCOUNTER — Other Ambulatory Visit: Payer: Self-pay

## 2020-12-23 DIAGNOSIS — M25512 Pain in left shoulder: Secondary | ICD-10-CM

## 2020-12-23 DIAGNOSIS — M6281 Muscle weakness (generalized): Secondary | ICD-10-CM

## 2020-12-23 DIAGNOSIS — R29898 Other symptoms and signs involving the musculoskeletal system: Secondary | ICD-10-CM

## 2020-12-23 NOTE — Therapy (Addendum)
[place], [place], [address] Phone: [phone]   Fax:  [phone]  Occupational Therapy Treatment  Patient Details  Name: Susan Liu MRN: [id number] Date of Birth: 07/24/1956 Referring Provider (OT): Dr. Arther Abbott   Encounter Date: 12/23/2020   OT End of Session - 12/23/20 1533    Visit Number 4    Number of Visits 4    Date for OT Re-Evaluation 01/02/21    Authorization Type [employer]; $25 copay    Authorization Time Period no visit limit    Progress Note Due on Visit 10    OT Start Time 1349    OT Stop Time 1431    OT Time Calculation (min) 42 min    Activity Tolerance Patient tolerated treatment well    Behavior During Therapy [facility] for tasks assessed/performed           Past Medical History:  Diagnosis Date  . Heart murmur 1993  . Hyperlipidemia   . Migraine   . Osteoporosis     Past Surgical History:  Procedure Laterality Date  . CESAREAN SECTION     x2  . COLONOSCOPY  2008   Dr.Rourk-normal exam    There were no vitals filed for this visit.   Subjective Assessment - 12/23/20 1352    Subjective  S: How do I do the wall exercise?    Currently in Pain? No/denies   To shoulder area.                       OT Treatments/Exercises (OP) - 12/23/20 1354      Exercises   Exercises Shoulder      Shoulder Exercises: Supine   Protraction PROM;5 reps;Strengthening    Protraction Weight (lbs) 1#    Horizontal ABduction PROM;5 reps;Strengthening;10 reps    Horizontal ABduction Weight (lbs) 1#    External Rotation PROM;5 reps;Strengthening;10 reps    Internal Rotation PROM;5 reps;Strengthening;10 reps    Internal Rotation Weight (lbs) 1#    Flexion PROM;5 reps;Strengthening;10 reps    ABduction PROM;5 reps;Strengthening;10 reps      Shoulder Exercises: Standing   Other Standing Exercises --    Other Standing Exercises 1# weight pass behind back and overhead  x10 with noted facial grimacing but no reports of difficulty; y lift off x5 demo for use at home.      Shoulder Exercises: Therapy Ball   Other Therapy Ball Exercises green therapy ball: chest press, flexion, circles each direction      Shoulder Exercises: ROM/Strengthening   UBE (Upper Arm Bike) level 1 resistance forward for 2'; level 2 reverse for 2'    X to V Arms 10X each; 1#    Proximal Shoulder Strengthening, Supine 10x 1# weight (x,paddle, circle)    Ball on Wall 1' flexion, 1' abduction      Shoulder Exercises: Stretch   External Rotation Stretch 2 reps;30 seconds   towel; good ROM with towl infrior to shoulder; increased to towel superior to shoulder.   Other Shoulder Stretches --      Manual Therapy   Manual Therapy Myofascial release    Manual therapy comments completed separately from therapeutic exercises    Myofascial Release myofascial release and manual techniques to left upper arm, trapezius, and scapular regions to decrease pain and fascial restrictions and increase joint ROM. Minimal tightness in superior trap. Less fascial restrictions/tightness overall.  OT Education - 12/23/20 1531    Education Details Pt educated on Y lift off exercise. Given therapy ball strengthening handout.    Person(s) Educated Patient    Methods Explanation;Demonstration    Comprehension Verbalized understanding;Returned demonstration            OT Short Term Goals - 12/16/20 1817      OT SHORT TERM GOAL #1   Title Pt will be provided with and educated on HEP to improve mobility required for ADLs.    Time 4    Period Weeks    Status On-going    Target Date 01/02/21      OT SHORT TERM GOAL #2   Title Pt will increase LUE A/ROM to WNL to improve ability to reach behind back.    Time 4    Period Weeks    Status On-going      OT SHORT TERM GOAL #3   Title Pt will decrease LUE pain to 2/10 or less to improve ability to sleep on left side as needed.     Time 4    Period Weeks    Status Achieved      OT SHORT TERM GOAL #4   Title Pt will decrease LUE fascial restrictions to minimal amounts to improve mobility required for functional reaching.    Time 4    Period Weeks    Status Achieved      OT SHORT TERM GOAL #5   Title Pt will increase RUE strength to 5/5 throughout to improve ability to lift and hold grandchildren.    Time 4    Period Weeks    Status On-going                    Plan - 12/23/20 1535    Clinical Impression Statement A: Manual techniques breifly completed to address and assess any fascial restricions. Minimal tightness noted primarily in suprior traps. Reviewed therapy ball strengthening and provided HEP. Reviewed Y lift off exercise per patient request. Pt was able to complete 1# weighted strenthening for the shoulder with no complaints and good performance. Shoulder stabilization completed with green weighted ball as well with no complaints or difficulty. Pt able to complete Upper extremity bike with initial difficulty but progressed to increase resistance to 2.    Body Structure / Function / Physical Skills ADL;UE functional use;Fascial restriction;Pain;ROM;IADL;Strength    Plan P: Reassess and discharge.   OT Home Exercise Plan eval 1/6: shoulder stretches; 1/13: standing red theraband strengthening 1/19: ball on the wall, X to V arms: 1/26 Therapy ball strengthening; y lift off    Consulted and Agree with Plan of Care Patient           Patient will benefit from skilled therapeutic intervention in order to improve the following deficits and impairments:   Body Structure / Function / Physical Skills: ADL,UE functional use,Fascial restriction,Pain,ROM,IADL,Strength       Visit Diagnosis: Other symptoms and signs involving the musculoskeletal system  Acute pain of left shoulder  Muscle weakness (generalized)    Problem List Patient Active Problem List   Diagnosis Date Noted  . Vasovagal near  syncope 06/12/2018  . Trochanteric bursitis, right hip 02/16/2017  . Osteoporosis 06/17/2016  . Atypical pigmented skin lesion 05/21/2015  . 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 Spasm of muscle 07/29/2014  . Stiffness of joints, not elsewhere classified, multiple sites 07/29/2014  . Osteopenia 06/05/2014  . Cervical nerve root compression 06/05/2014  . Osteoporosis,  unspecified 05/24/2013  . Hyperlipemia 05/24/2013  . Migraines 05/03/2013   Larey Seat OT, MOT  Larey Seat 12/23/2020, 3:51 PM  [place] [address] [place], [place], [address] Phone: [phone]   Fax:  [phone]  Name: Susan Liu MRN: [id number] Date of Birth: [birth year]

## 2020-12-23 NOTE — Patient Instructions (Signed)
Therapy Ball Strengthening Exercises: Complete all exercises 10-15X each, 2x/day.   1) Overhead press: Hold a medicine ball or other ball at your chest. Next, extend your elbows and push the ball over your head as shown. Return to starting position and repeat.     2) Chest press: Hold a medicine ball or other ball at your chest. Next, extend your elbows and push the ball outward in front of your body as shown. Return to starting position and repeat.     3) Ball circles:  Hold a medicine ball or other ball at your chest. Next, extend your elbows and push the ball outward in front of your body as shown. Then, move the ball in a circular pattern for several revolutions and then reverse the direction.     4) Flexion: Start holding an exercise ball out in front of your body with elbows extended. Then, slowly lift the ball up over head. Return the ball to starting position and repeat.     5) Ball diagonals: Begin holding a ball with both hands and then move it through a diagonal pattern from your hip to over your opposite shoulder as shown.    

## 2020-12-30 ENCOUNTER — Ambulatory Visit (HOSPITAL_COMMUNITY): Payer: Medicare HMO | Attending: Orthopedic Surgery

## 2020-12-30 ENCOUNTER — Other Ambulatory Visit: Payer: Self-pay

## 2020-12-30 DIAGNOSIS — R29898 Other symptoms and signs involving the musculoskeletal system: Secondary | ICD-10-CM | POA: Diagnosis present

## 2020-12-30 DIAGNOSIS — M6281 Muscle weakness (generalized): Secondary | ICD-10-CM | POA: Diagnosis not present

## 2020-12-30 DIAGNOSIS — M25512 Pain in left shoulder: Secondary | ICD-10-CM | POA: Insufficient documentation

## 2020-12-30 NOTE — Therapy (Signed)
[place], [place], [address] Phone: [phone]   Fax:  [phone]  Occupational Therapy Treatment Reassessment/discharge summary Patient Details  Name: Susan Liu MRN: [id number] Date of Birth: 04-18-1956 Referring Provider (OT): Dr. Arther Abbott  AROM  Overall AROM Comments Assessed seated, er/IR adducted   AROM Assessment Site Shoulder   Right/Left Shoulder Left   Left Shoulder Flexion 155 Degrees   previous: 150  Left Shoulder ABduction 165 Degrees   previous:140  Left Shoulder Internal Rotation 90 Degrees   previous: same  Left Shoulder External Rotation 70 Degrees   previous: 52    PROM  Overall PROM Comments P/ROM is [facility]     Strength  Overall Strength Comments Assessed seated, er/IR adducted   Strength Assessment Site Shoulder   Right/Left Shoulder Left   Left Shoulder Flexion 5/5   previous: same  Left Shoulder ABduction 5/5   previous: 4+/5  Left Shoulder Internal Rotation 5/5   previous: same  Left Shoulder External Rotation 5/5   previous: 4/5   Encounter Date: 12/30/2020   OT End of Session - 12/30/20 1507    Visit Number 5    Number of Visits 5    Authorization Type [employer] Medicare; $25 copay    Authorization Time Period no visit limit    Progress Note Due on Visit 10    OT Start Time 1435   reassess and discharge   OT Stop Time 1500    OT Time Calculation (min) 25 min    Activity Tolerance Patient tolerated treatment well    Behavior During Therapy WFL for tasks assessed/performed           Past Medical History:  Diagnosis Date  . Heart murmur 1993  . Hyperlipidemia   . Migraine   . Osteoporosis     Past Surgical History:  Procedure Laterality Date  . CESAREAN SECTION     x2  . COLONOSCOPY  2008   Dr.Rourk-normal exam    There were no vitals filed for this visit.   Subjective Assessment - 12/30/20 1505    Subjective  S: I can tell when I move it that it's not  100% but it's not hurting like it was before.    Currently in Pain? No/denies              [facility] OT Assessment - 12/30/20 1436      Assessment   Medical Diagnosis Left shoulder pain      Precautions   Precautions None      Observation/Other Assessments   Focus on Therapeutic Outcomes (FOTO)  66/100      ROM / Strength   AROM / PROM / Strength AROM;Strength      Palpation   Palpation comment No fascial restrictions noted in left upper arm, trapezius, and scapularis region.      AROM   Overall AROM Comments Assessed seated, er/IR adducted    AROM Assessment Site Shoulder    Right/Left Shoulder Left    Left Shoulder Flexion 155 Degrees   previous: 150   Left Shoulder ABduction 165 Degrees   previous:140   Left Shoulder Internal Rotation 90 Degrees   previous: same   Left Shoulder External Rotation 70 Degrees   previous: 52     PROM   Overall PROM Comments P/ROM is [facility]      Strength   Overall Strength Comments Assessed seated, er/IR adducted    Strength Assessment Site Shoulder  Right/Left Shoulder Left    Left Shoulder Flexion 5/5   previous: same   Left Shoulder ABduction 5/5   previous: 4+/5   Left Shoulder Internal Rotation 5/5   previous: same   Left Shoulder External Rotation 5/5   previous: 4/5                           OT Education - 12/30/20 1505    Education Details reviewed progress in therapy. Reviewd HEP and recommended that patient continue with shoulder stretches daily as needed. Complete either resistive bands or ball exercises at least 3 times a week. Reviwed exercises for proper technique and form. All education complete and questions addressed.    Person(s) Educated Patient    Methods Explanation;Demonstration    Comprehension Verbalized understanding;Returned demonstration            OT Short Term Goals - 12/30/20 1508      OT SHORT TERM GOAL #1   Title Pt will be provided with and educated on HEP to improve mobility  required for ADLs.    Time 4    Period Weeks    Status Achieved    Target Date 01/02/21      OT SHORT TERM GOAL #2   Title Pt will increase LUE A/ROM to WNL to improve ability to reach behind back.    Time 4    Period Weeks    Status Achieved      OT SHORT TERM GOAL #3   Title Pt will decrease LUE pain to 2/10 or less to improve ability to sleep on left side as needed.    Time 4    Period Weeks    Status Achieved      OT SHORT TERM GOAL #4   Title Pt will decrease LUE fascial restrictions to minimal amounts to improve mobility required for functional reaching.    Time 4    Period Weeks    Status Achieved      OT SHORT TERM GOAL #5   Title Pt will increase RUE strength to 5/5 throughout to improve ability to lift and hold grandchildren.    Time 4    Period Weeks    Status Achieved                    Plan - 12/30/20 1509    Clinical Impression Statement A: Reassessment completed this date. patient is domonstrating functional to full A/ROM of the left shoulder, 5/5 strength in all shoulder ranges, zero fascial restrictions, and no reports of pain this date. All therapy goals have been met. Patient reports that she can tell it feels different with certain motions although she does not have any pain. Reviewed HEP and education was provided for which exercises and stretches to continue with at home. Reviewed exercises for proper form and technique. All education was completed and questions answered. pt is ready for discharge from therapy.    Body Structure / Function / Physical Skills ADL;UE functional use;Fascial restriction;Pain;ROM;IADL;Strength    Plan P: D/C from OT services with HEP. patient to follow up with OT as needed. MD follow up appointment on 01/06/21.    Consulted and Agree with Plan of Care Patient           Patient will benefit from skilled therapeutic intervention in order to improve the following deficits and impairments:   Body Structure / Function /  Physical Skills: ADL,UE functional use,Fascial  restriction,Pain,ROM,IADL,Strength       Visit Diagnosis: Muscle weakness (generalized)  Acute pain of left shoulder  Other symptoms and signs involving the musculoskeletal system    Problem List Patient Active Problem List   Diagnosis Date Noted  . Vasovagal near syncope 06/12/2018  . Trochanteric bursitis, right hip 02/16/2017  . Osteoporosis 06/17/2016  . Atypical pigmented skin lesion 05/21/2015  . 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 Spasm of muscle 07/29/2014  . Stiffness of joints, not elsewhere classified, multiple sites 07/29/2014  . Osteopenia 06/05/2014  . Cervical nerve root compression 06/05/2014  . Osteoporosis, unspecified 05/24/2013  . Hyperlipemia 05/24/2013  . Migraines 05/03/2013     OCCUPATIONAL THERAPY DISCHARGE SUMMARY  Visits from Start of Care: 5  Current functional level related to goals / functional outcomes: See above   Remaining deficits: None   Education / Equipment: Shoulder strengthening, stretches Plan: Patient agrees to discharge.  Patient goals were met. Patient is being discharged due to meeting the stated rehab goals.  ?????      Ailene Ravel, OTR/L,CBIS  [phone]  12/30/2020, 3:13 PM  [place] [address] [place], [place], [address] Phone: [phone]   Fax:  [phone]  Name: PURITY IRMEN MRN: [id number] Date of Birth: July 22, 1956

## 2020-12-30 NOTE — Patient Instructions (Signed)
Discharge instructions:  1) Continue with shoulder stretches daily as needed.  2) Pick either the resistive bands or ball exercises and complete at least 3 times a week.

## 2021-01-06 ENCOUNTER — Ambulatory Visit (INDEPENDENT_AMBULATORY_CARE_PROVIDER_SITE_OTHER): Payer: Medicare HMO | Admitting: Orthopedic Surgery

## 2021-01-06 ENCOUNTER — Encounter: Payer: Self-pay | Admitting: Orthopedic Surgery

## 2021-01-06 ENCOUNTER — Other Ambulatory Visit: Payer: Self-pay

## 2021-01-06 VITALS — BP 117/69 | HR 74 | Ht 60.0 in | Wt 120.0 lb

## 2021-01-06 DIAGNOSIS — M24512 Contracture, left shoulder: Secondary | ICD-10-CM

## 2021-01-06 NOTE — Progress Notes (Signed)
  Chief Complaint  Patient presents with  . Shoulder Pain    Left/ better with therapy     Encounter Diagnosis  Name Primary?  . Shoulder joint contracture, left Yes    65 years old finished physical therapy much improved has no pain range of motion is better  Exam shows equal strength side to side with 5 out of 5 cuff strength Internal rotation to T7 each side  Continue exercises follow-up as needed

## 2021-02-01 ENCOUNTER — Encounter: Payer: Self-pay | Admitting: Family Medicine

## 2021-02-01 DIAGNOSIS — M7061 Trochanteric bursitis, right hip: Secondary | ICD-10-CM

## 2021-02-01 NOTE — Telephone Encounter (Signed)
Called pt to let her know specialist needs to do prior auth for injection. She states she has a letter from specialist that states she needs prior auth done by pcp before her visit. I asked her to bring letter so I could look at it. Await letter. She may fax, drop off or send through mychart. No sure yet.

## 2021-02-01 NOTE — Telephone Encounter (Signed)
Please see other mychart message with copy of letter.

## 2021-02-02 ENCOUNTER — Encounter: Payer: Self-pay | Admitting: Family Medicine

## 2021-02-02 ENCOUNTER — Other Ambulatory Visit: Payer: Self-pay | Admitting: Family Medicine

## 2021-02-02 DIAGNOSIS — Z1211 Encounter for screening for malignant neoplasm of colon: Secondary | ICD-10-CM

## 2021-02-02 NOTE — Telephone Encounter (Signed)
Nurses may have referral  Nurses-also patient is due IFBOT please set up (the last one I saw in the chart was from September 2020 she does these yearly with Hoyle Sauer.  If this was already completed last fall-2021 please note result in chart thank you

## 2021-02-02 NOTE — Addendum Note (Signed)
Addended by: Vicente Males on: 02/02/2021 01:22 PM   Modules accepted: Orders

## 2021-02-02 NOTE — Telephone Encounter (Signed)
See other mychart message.

## 2021-02-08 ENCOUNTER — Other Ambulatory Visit: Payer: Self-pay | Admitting: *Deleted

## 2021-02-08 DIAGNOSIS — Z1211 Encounter for screening for malignant neoplasm of colon: Secondary | ICD-10-CM

## 2021-02-08 LAB — IFOBT (OCCULT BLOOD): IFOBT: NEGATIVE

## 2021-04-09 ENCOUNTER — Encounter: Payer: Self-pay | Admitting: Family Medicine

## 2021-06-02 ENCOUNTER — Other Ambulatory Visit: Payer: Self-pay | Admitting: Family Medicine

## 2021-06-08 ENCOUNTER — Other Ambulatory Visit (HOSPITAL_COMMUNITY): Payer: Self-pay | Admitting: Family Medicine

## 2021-06-08 ENCOUNTER — Encounter: Payer: Self-pay | Admitting: Family Medicine

## 2021-06-08 ENCOUNTER — Telehealth: Payer: Self-pay | Admitting: Family Medicine

## 2021-06-08 DIAGNOSIS — Z79899 Other long term (current) drug therapy: Secondary | ICD-10-CM

## 2021-06-08 DIAGNOSIS — E785 Hyperlipidemia, unspecified: Secondary | ICD-10-CM

## 2021-06-08 DIAGNOSIS — Z1321 Encounter for screening for nutritional disorder: Secondary | ICD-10-CM

## 2021-06-08 DIAGNOSIS — Z1231 Encounter for screening mammogram for malignant neoplasm of breast: Secondary | ICD-10-CM

## 2021-06-08 MED ORDER — PRAVASTATIN SODIUM 80 MG PO TABS: 80.0000 mg | ORAL_TABLET | Freq: Every day | ORAL | 0 refills | Status: DC

## 2021-06-08 NOTE — Addendum Note (Signed)
Addended by: Vicente Males on: 06/08/2021 04:26 PM   Modules accepted: Orders

## 2021-06-08 NOTE — Telephone Encounter (Signed)
Needing labs done for physical but wants to wait until September for physical but wanting to do labs now.

## 2021-06-08 NOTE — Telephone Encounter (Signed)
Lipid, liver, metabolic 7, vitamin D 

## 2021-06-08 NOTE — Telephone Encounter (Signed)
Nurses-you may have a 90-day prescription on her pravastatin to cover her through her physical.  Please advise her where to go for her lab work thank you

## 2021-06-08 NOTE — Telephone Encounter (Signed)
Last labs 03/02/20:  Lipid, Liver, CBC, Vit D, Met 7

## 2021-06-08 NOTE — Telephone Encounter (Signed)
Blood work ordered in [facility]. Patient notified via My Chart(see My Chart Message)

## 2021-06-10 ENCOUNTER — Other Ambulatory Visit: Payer: Self-pay

## 2021-06-10 ENCOUNTER — Ambulatory Visit (HOSPITAL_COMMUNITY)
Admission: RE | Admit: 2021-06-10 | Discharge: 2021-06-10 | Disposition: A | Discharge status: Home / Self Care | Payer: Medicare HMO | Source: Ambulatory Visit | Attending: Family Medicine | Admitting: Family Medicine

## 2021-06-10 DIAGNOSIS — Z1231 Encounter for screening mammogram for malignant neoplasm of breast: Secondary | ICD-10-CM | POA: Insufficient documentation

## 2021-06-12 LAB — HEPATIC FUNCTION PANEL
ALT: 17 IU/L (ref 0–32)
AST: 23 IU/L (ref 0–40)
Albumin: 4.6 g/dL (ref 3.8–4.8)
Alkaline Phosphatase: 67 IU/L (ref 44–121)
Bilirubin Total: 0.6 mg/dL (ref 0.0–1.2)
Bilirubin, Direct: 0.15 mg/dL (ref 0.00–0.40)
Total Protein: 6.9 g/dL (ref 6.0–8.5)

## 2021-06-12 LAB — VITAMIN D 25 HYDROXY (VIT D DEFICIENCY, FRACTURES): Vit D, 25-Hydroxy: 42 ng/mL (ref 30.0–100.0)

## 2021-06-12 LAB — BASIC METABOLIC PANEL
BUN/Creatinine Ratio: 20 (ref 12–28)
BUN: 17 mg/dL (ref 8–27)
CO2: 25 mmol/L (ref 20–29)
Calcium: 9.5 mg/dL (ref 8.7–10.3)
Chloride: 101 mmol/L (ref 96–106)
Creatinine, Ser: 0.86 mg/dL (ref 0.57–1.00)
Glucose: 90 mg/dL (ref 65–99)
Potassium: 4.3 mmol/L (ref 3.5–5.2)
Sodium: 141 mmol/L (ref 134–144)
eGFR: 75 mL/min/{1.73_m2} (ref >59)

## 2021-06-12 LAB — LIPID PANEL
Chol/HDL Ratio: 3.2 ratio (ref 0.0–4.4)
Cholesterol, Total: 249 mg/dL — ABNORMAL HIGH (ref 100–199)
HDL: 79 mg/dL (ref >39)
LDL Chol Calc (NIH): 154 mg/dL — ABNORMAL HIGH (ref 0–99)
Triglycerides: 93 mg/dL (ref 0–149)
VLDL Cholesterol Cal: 16 mg/dL (ref 5–40)

## 2021-06-13 ENCOUNTER — Encounter: Payer: Self-pay | Admitting: Family Medicine

## 2021-06-14 ENCOUNTER — Other Ambulatory Visit (HOSPITAL_COMMUNITY): Payer: Self-pay | Admitting: Family Medicine

## 2021-06-14 ENCOUNTER — Encounter: Payer: Self-pay | Admitting: Family Medicine

## 2021-06-14 DIAGNOSIS — R928 Other abnormal and inconclusive findings on diagnostic imaging of breast: Secondary | ICD-10-CM

## 2021-06-18 ENCOUNTER — Encounter: Payer: Self-pay | Admitting: Family Medicine

## 2021-06-18 NOTE — Progress Notes (Signed)
Patient notified and verbalized understanding. 

## 2021-06-22 ENCOUNTER — Ambulatory Visit
Admission: EM | Admit: 2021-06-22 | Discharge: 2021-06-22 | Disposition: A | Discharge status: Home / Self Care | Payer: Medicare HMO | Attending: Family Medicine | Admitting: Family Medicine

## 2021-06-22 DIAGNOSIS — J029 Acute pharyngitis, unspecified: Secondary | ICD-10-CM

## 2021-06-22 DIAGNOSIS — R0981 Nasal congestion: Secondary | ICD-10-CM

## 2021-06-22 DIAGNOSIS — H1031 Unspecified acute conjunctivitis, right eye: Secondary | ICD-10-CM

## 2021-06-22 MED ORDER — PREDNISONE 20 MG PO TABS: 40.0000 mg | ORAL_TABLET | Freq: Every day | ORAL | 0 refills | Status: DC

## 2021-06-22 MED ORDER — OLOPATADINE HCL 0.2 % OP SOLN: 1.0000 [drp] | Freq: Every day | OPHTHALMIC | 0 refills | Status: DC

## 2021-06-22 NOTE — ED Provider Notes (Signed)
[place]   VM:[id number] 06/22/21 Arrival Time: 0845  ASSESSMENT & PLAN:  1. Nasal congestion   2. Sore throat   3. Acute conjunctivitis of right eye, unspecified acute conjunctivitis type    Should be improving soon. No signs of bacterial infection. Home COVID test negative per pt report. OTC symptom care and Zyrtec as needed. Mupirocin to nose 3x daily for 5 days. Has at home.  Begin trial of: Meds ordered this encounter  Medications   predniSONE (DELTASONE) 20 MG tablet    Sig: Take 2 tablets (40 mg total) by mouth daily.    Dispense:  10 tablet    Refill:  0   Olopatadine HCl 0.2 % SOLN    Sig: Apply 1 drop to eye daily.    Dispense:  2.5 mL    Refill:  0     Follow-up Information     Kathyrn Drown, MD.   Specialty: Family Medicine Why: If worsening or failing to improve as anticipated. Contact information: [place] [address] [phone]                 Reviewed expectations re: course of current medical issues. Questions answered. Outlined signs and symptoms indicating need for more acute intervention. Understanding verbalized. After Visit Summary given.   SUBJECTIVE: History from: patient. Susan Liu is a 65 y.o. female who reports nasal congestion; throat irritation over past week. Past day with R eye redness and watery drainage; gritty feeling. Sneezing at times. Denies: difficulty breathing. Normal PO intake without n/v/d.  OBJECTIVE:  Vitals:   06/22/21 0902  BP: 114/74  Pulse: 75  Resp: 20  Temp: 98.6 F (37 C)  SpO2: 97%    General appearance: alert; no distress Eyes: PERRLA; EOMI; conjunctiva normal HENT: [place]; AT; with nasal congestion and nare irritation/erythema; throat cobblestoning Neck: supple without LAD Lungs: speaks full sentences without difficulty; unlabored Extremities: no edema Skin: warm and dry Neurologic: normal gait Psychological: alert and cooperative; normal mood  and affect    Allergies  Allergen Reactions   Erythromycin Itching    Past Medical History:  Diagnosis Date   Heart murmur 1993   Hyperlipidemia    Migraine    Osteoporosis    Social History   Socioeconomic History   Marital status: Married    Spouse name: Not on file   Number of children: Not on file   Years of education: Not on file   Highest education level: Not on file  Occupational History   Not on file  Tobacco Use   Smoking status: Never   Smokeless tobacco: Never  Vaping Use   Vaping Use: Never used  Substance and Sexual Activity   Alcohol use: Not Currently   Drug use: Not Currently   Sexual activity: Yes    Birth control/protection: Post-menopausal  Other Topics Concern   Not on file  Social History Narrative   Not on file   Social Determinants of Health   Financial Resource Strain: Not on file  Food Insecurity: Not on file  Transportation Needs: Not on file  Physical Activity: Not on file  Stress: Not on file  Social Connections: Not on file  Intimate Partner Violence: Not on file   Family History  Problem Relation Age of Onset   Cancer Mother        Lung   Colon polyps Mother    Heart attack Father    Colon cancer Neg Hx  Esophageal cancer Neg Hx    Rectal cancer Neg Hx    Stomach cancer Neg Hx    Past Surgical History:  Procedure Laterality Date   CESAREAN SECTION     x2   COLONOSCOPY  2008   Dr.Rourk-normal exam     Vanessa Kick, MD 06/22/21 1047

## 2021-06-22 NOTE — ED Triage Notes (Signed)
Pt had pneumonia vaccine last week then developed nasal congestion and right eye irritation with redness also has sore throat . Covid test negative

## 2021-06-25 ENCOUNTER — Ambulatory Visit (HOSPITAL_COMMUNITY)
Admission: RE | Admit: 2021-06-25 | Discharge: 2021-06-25 | Disposition: A | Discharge status: Home / Self Care | Payer: Medicare HMO | Source: Ambulatory Visit | Attending: Family Medicine | Admitting: Family Medicine

## 2021-06-25 ENCOUNTER — Other Ambulatory Visit: Payer: Self-pay

## 2021-06-25 ENCOUNTER — Encounter (HOSPITAL_COMMUNITY): Payer: Self-pay

## 2021-06-25 DIAGNOSIS — R928 Other abnormal and inconclusive findings on diagnostic imaging of breast: Secondary | ICD-10-CM

## 2021-06-28 ENCOUNTER — Other Ambulatory Visit: Payer: Self-pay

## 2021-06-28 ENCOUNTER — Telehealth (INDEPENDENT_AMBULATORY_CARE_PROVIDER_SITE_OTHER): Payer: Medicare HMO | Admitting: Family Medicine

## 2021-06-28 VITALS — HR 82 | Temp 98.6°F | Wt 119.0 lb

## 2021-06-28 DIAGNOSIS — J019 Acute sinusitis, unspecified: Secondary | ICD-10-CM

## 2021-06-28 DIAGNOSIS — B9689 Other specified bacterial agents as the cause of diseases classified elsewhere: Secondary | ICD-10-CM

## 2021-06-28 DIAGNOSIS — Z79899 Other long term (current) drug therapy: Secondary | ICD-10-CM | POA: Diagnosis not present

## 2021-06-28 DIAGNOSIS — E785 Hyperlipidemia, unspecified: Secondary | ICD-10-CM

## 2021-06-28 MED ORDER — ROSUVASTATIN CALCIUM 20 MG PO TABS: 20.0000 mg | ORAL_TABLET | Freq: Every day | ORAL | 1 refills | Status: DC

## 2021-06-28 MED ORDER — AMOXICILLIN 500 MG PO CAPS: 500.0000 mg | ORAL_CAPSULE | Freq: Three times a day (TID) | ORAL | 0 refills | Status: AC

## 2021-06-28 NOTE — Addendum Note (Signed)
Addended by: Vicente Males on: 06/28/2021 02:15 PM   Modules accepted: Orders

## 2021-06-28 NOTE — Progress Notes (Signed)
Subjective:  Due to the complexity of the patient she was brought to the office to be seen  Patient ID: Susan Liu, female    DOB: 02/15/1956, 65 y.o.   MRN: [id number] I connected with  Zakayla G Labrie on 06/28/21 by a video enabled telemedicine application and verified that I am speaking with the correct person using two identifiers.   I discussed the limitations of evaluation and management by telemedicine. The patient expressed understanding and agreed to proceed.  Patient location: home  Provider location: in office  I provided 0 see above minutes of non face - to - face time during this encounter.  Hyperlipidemia  Conjunctivitis   Using prescribed eye drops  Patient started off with a viral illness last week was seen in the urgent care did a home test through [facility] was negative for COVID patient currently with head congestion sinus not feeling good low energy denies high fever chills had a little bit of sharp pains in her chest earlier today but those are gone breathing is doing well Congestion - recent UC visit completed course of steroids  We are also here to discuss her lab work.  Cholesterol not under good control with current measures.  Healthy diet recommended.  Results for orders placed or performed in visit on 06/08/21  Lipid panel  Result Value Ref Range   Cholesterol, Total 249 (H) 100 - 199 mg/dL   Triglycerides 93 0 - 149 mg/dL   HDL 79 >39 mg/dL   VLDL Cholesterol Cal 16 5 - 40 mg/dL   LDL Chol Calc (NIH) 154 (H) 0 - 99 mg/dL   Chol/HDL Ratio 3.2 0.0 - 4.4 ratio  Hepatic function panel  Result Value Ref Range   Total Protein 6.9 6.0 - 8.5 g/dL   Albumin 4.6 3.8 - 4.8 g/dL   Bilirubin Total 0.6 0.0 - 1.2 mg/dL   Bilirubin, Direct 0.15 0.00 - 0.40 mg/dL   Alkaline Phosphatase 67 44 - 121 IU/L   AST 23 0 - 40 IU/L   ALT 17 0 - 32 IU/L  Basic metabolic panel  Result Value Ref Range   Glucose 90 65 - 99 mg/dL   BUN 17 8 - 27 mg/dL   Creatinine, Ser  0.86 0.57 - 1.00 mg/dL   eGFR 75 >59 mL/min/1.73   BUN/Creatinine Ratio 20 12 - 28   Sodium 141 134 - 144 mmol/L   Potassium 4.3 3.5 - 5.2 mmol/L   Chloride 101 96 - 106 mmol/L   CO2 25 20 - 29 mmol/L   Calcium 9.5 8.7 - 10.3 mg/dL  VITAMIN D 25 Hydroxy (Vit-D Deficiency, Fractures)  Result Value Ref Range   Vit D, 25-Hydroxy 42.0 30.0 - 100.0 ng/mL     Review of Systems     Objective:   Physical Exam  Eardrums normal throat is normal bilateral conjunctivitis worse on the left than the right No wheezing or difficulty breathing lungs sound clear heart regular     Assessment & Plan:  Viral syndrome Secondary sinusitis Amoxicillin 500 mg 3 times daily 10 days follow-up if progressive troubles or worse Warnings discussed COVID PCR taken  Subpar control of cholesterol switch to Crestor daily repeat lab work in 3 to 4 months.  

## 2021-06-28 NOTE — Progress Notes (Signed)
06/28/21-lab orders placed and mailed to patient.

## 2021-06-29 LAB — SARS-COV-2, NAA 2 DAY TAT

## 2021-06-29 LAB — NOVEL CORONAVIRUS, NAA: SARS-CoV-2, NAA: NOT DETECTED

## 2021-07-01 ENCOUNTER — Encounter: Payer: Self-pay | Admitting: Family Medicine

## 2021-07-01 NOTE — Telephone Encounter (Signed)
Hi Liann The drainage is a byproduct of the respiratory illness.  It will probably be relatively persistent until it finally clears up.  With severe viral illnesses and respiratory illnesses sometimes can take 2 to 3 weeks for this to stop.  Robitussin and Robitussin-DM are safe to use to try to help take the edge off of these issues.  Hopefully over the next 5 to 7 days you see significant improvement.  If any problems let us know.  Thanks-Dr. Nicki Reaper

## 2021-07-13 ENCOUNTER — Encounter (HOSPITAL_COMMUNITY): Payer: Medicare HMO

## 2021-07-13 ENCOUNTER — Other Ambulatory Visit (HOSPITAL_COMMUNITY): Payer: Medicare HMO

## 2021-07-14 ENCOUNTER — Telehealth: Payer: Self-pay | Admitting: Family Medicine

## 2021-07-14 MED ORDER — CEFDINIR 300 MG PO CAPS: 300.0000 mg | ORAL_CAPSULE | Freq: Two times a day (BID) | ORAL | 0 refills | Status: DC

## 2021-07-14 NOTE — Telephone Encounter (Signed)
Pt contacted. Pt states she is having a dry cough (at night she is coughing up "the yellow gunk"), having congesting, sinus pressure(feels like her whole head is clogged up including right ear), outside of right eye is red, dry and eye is watering (no conjunctivitis); no fever, hoarse from coughing and trying to clear throat, no sneezing, no shortness of breath. Pt states she felt the antibiotic did help and symptoms improved but after she completed the course, about 4-5 days later symptoms came back, but no conjunctivitis. Please advise. Thank you

## 2021-07-14 NOTE — Telephone Encounter (Signed)
Prescription sent electronically to pharmacy. Patient advised per Dr Nicki Reaper : Given her symptoms it would be reasonable to use Omnicef 300 mg 1 twice daily for 10 days Post viral sinus congestion and coughing can sometimes linger on for some individuals in addition at 3 to 4 weeks The antibiotic should help take care of any residual illness then the congestion should just gradually get better Follow-up if any ongoing troubles  Patient verbalized understanding.

## 2021-07-14 NOTE — Addendum Note (Signed)
Addended by: Dairl Ponder on: 07/14/2021 04:26 PM   Modules accepted: Orders

## 2021-07-14 NOTE — Telephone Encounter (Signed)
Given her symptoms it would be reasonable to use Omnicef 300 mg 1 twice daily for 10 days Post viral sinus congestion and coughing can sometimes linger on for some individuals in addition at 3 to 4 weeks The antibiotic should help take care of any residual illness then the congestion should just gradually get better Follow-up if any ongoing troubles

## 2021-07-14 NOTE — Telephone Encounter (Signed)
Please advise. Thank you

## 2021-07-14 NOTE — Telephone Encounter (Signed)
So please lets go ahead and get a update on her symptoms  Coughing?  Congestion?  Sinus pressure?  Fevers?  Shortness of breath?  Any mucus production if so what?  Is she improving some or not any at all? Did previous antibiotic help at all? If she having allergy issues such as watery eyes sneezing itchy eyes?

## 2021-07-14 NOTE — Telephone Encounter (Signed)
Patient was seen on 8/1 with watery eyes and cough and now patient states its back wanting something else called into [facility]

## 2021-07-26 ENCOUNTER — Encounter: Payer: Self-pay | Admitting: Family Medicine

## 2021-07-26 NOTE — Telephone Encounter (Signed)
Nurses Most likely these are side effects from possibly be antibiotics Could even be related to change in intestinal flora related to being on an antibiotic  It would be fine to hold off on the cholesterol medicine for 1 to 2 weeks and then resume-obviously if any possible side effects to notify us  May utilize OTC probiotic over the next 7 days  If any ongoing troubles let us know thanks-Dr. Nicki Reaper

## 2021-09-03 ENCOUNTER — Encounter: Payer: Self-pay | Admitting: Nurse Practitioner

## 2021-09-03 ENCOUNTER — Other Ambulatory Visit: Payer: Self-pay

## 2021-09-03 ENCOUNTER — Ambulatory Visit (INDEPENDENT_AMBULATORY_CARE_PROVIDER_SITE_OTHER): Payer: Medicare HMO | Admitting: Nurse Practitioner

## 2021-09-03 VITALS — BP 134/72 | Ht 60.0 in | Wt 118.4 lb

## 2021-09-03 DIAGNOSIS — Z23 Encounter for immunization: Secondary | ICD-10-CM

## 2021-09-03 DIAGNOSIS — R922 Inconclusive mammogram: Secondary | ICD-10-CM

## 2021-09-03 DIAGNOSIS — Z01411 Encounter for gynecological examination (general) (routine) with abnormal findings: Secondary | ICD-10-CM | POA: Diagnosis not present

## 2021-09-03 DIAGNOSIS — Z0000 Encounter for general adult medical examination without abnormal findings: Secondary | ICD-10-CM

## 2021-09-03 DIAGNOSIS — N942 Vaginismus: Secondary | ICD-10-CM

## 2021-09-03 DIAGNOSIS — Z01419 Encounter for gynecological examination (general) (routine) without abnormal findings: Secondary | ICD-10-CM

## 2021-09-03 DIAGNOSIS — Z1211 Encounter for screening for malignant neoplasm of colon: Secondary | ICD-10-CM | POA: Diagnosis not present

## 2021-09-03 DIAGNOSIS — Z0001 Encounter for general adult medical examination with abnormal findings: Secondary | ICD-10-CM

## 2021-09-03 DIAGNOSIS — R9230 Dense breasts, unspecified: Secondary | ICD-10-CM

## 2021-09-03 NOTE — Progress Notes (Signed)
Subjective:    Patient ID: Susan Liu, female    DOB: 12-23-1955, 65 y.o.   MRN: [id number]  HPI Patient presents for physical today. Denies any recent falls or accidents within the last 6 months. Feels that health is good. Follows dash diet and exercises daily. Complains of vaginal pain especially during intercourse with husband. Mainly at the introitus.Denies any vaginal bleeding or discharge. Denies any need for STI testing. Eye, dental and mammogram UTD. Needs referral for colonoscopy. Denies any headaches, blurred vision, dizziness, SOB, or chest pain. Denies any abdominal pain, constipation, diarrhea, or N/V. At times has mild stress incontinence but does not affect life. Will receive flu shot while here today. UTD on shingles, pneumonia, covid vaccines. Has not received current covid-booster but will consider.  Dr. Nicki Reaper saw her on 06/28/21. Cholesterol med changed. Patient is aware of repeat labs.   Review of Systems  Constitutional:  Negative for chills, fatigue and fever.  HENT:  Negative for sore throat and trouble swallowing.   Eyes:  Negative for visual disturbance.  Respiratory:  Negative for apnea, cough, chest tightness and shortness of breath.   Cardiovascular:  Negative for chest pain.  Gastrointestinal:  Negative for blood in stool, constipation, diarrhea, nausea and vomiting.  Genitourinary:  Positive for vaginal pain. Negative for difficulty urinating, frequency, genital sores, pelvic pain, vaginal bleeding and vaginal discharge.  Neurological:  Negative for dizziness and headaches.      Objective:   Physical Exam Vitals and nursing note reviewed. Exam conducted with a chaperone present.  Constitutional:      Appearance: Normal appearance.  Neck:     Comments: Thyroid non-tender, no enlargement or goiter, no masses noted.  Cardiovascular:     Rate and Rhythm: Normal rate and regular rhythm.  Pulmonary:     Effort: Pulmonary effort is normal.     Breath sounds:  Normal breath sounds. No wheezing.  Chest:  Breasts:    Right: Normal. No swelling, bleeding, inverted nipple, mass, skin change or tenderness.     Left: Normal. No swelling, bleeding, inverted nipple, mass, skin change or tenderness.  Abdominal:     Palpations: Abdomen is soft.     Tenderness: There is no abdominal tenderness. There is no guarding or rebound.  Genitourinary:    General: Normal vulva.     Exam position: Lithotomy position.     Labia:        Right: No rash, tenderness or lesion.        Left: No rash, tenderness or lesion.   Musculoskeletal:     Cervical back: Neck supple.  Lymphadenopathy:     Upper Body:     Right upper body: No supraclavicular, axillary or pectoral adenopathy.     Left upper body: No supraclavicular, axillary or pectoral adenopathy.  Skin:    General: Skin is warm and dry.  Neurological:     Mental Status: She is alert and oriented to person, place, and time.  Psychiatric:        Mood and Affect: Mood normal.        Behavior: Behavior normal.        Thought Content: Thought content normal.        Judgment: Judgment normal.   .. Vitals:   09/03/21 0941  BP: 134/72  Height: 5' (1.524 m)  Weight: 118 lb 6.4 oz (53.7 kg)  BMI (Calculated): 23.12    Ty[place]Cuzick lifetime risk of breast cancer is 4.60%  Labs up to  date per Dr. Nicki Reaper in July.       Assessment & Plan:   Problem List Items Addressed This Visit       Other   Dense breasts   Vaginismus   Other Visit Diagnoses     Well woman exam    -  Primary   Need for vaccination       Relevant Orders   Flu Vaccine QUAD High Dose(Fluad) (Completed)   Screening for colon cancer       Relevant Orders   Ambulatory referral to Gastroenterology        Plan -discussed Cologuard, iFOBT versus colonoscopy. Patient agrees with colonoscopy. Referral for screening colonoscopy -Luvena OTC for vaginal dryness; KY jelly for intercourse; discussed other ways to help vaginismus -Flu  shot today  My chart message sent regarding options for pneumonia vaccine. No documentation available about the type of vaccine that she has received.  Return for Follow up with [facility] as planned.. Repeat physical in one year.

## 2021-09-03 NOTE — Progress Notes (Signed)
   Subjective:    Patient ID: Susan Liu, female    DOB: 1956/01/06, 65 y.o.   MRN: [id number]  HPI  The patient comes in today for a wellness visit.    A review of their health history was completed.  A review of medications was also completed.  Any needed refills; no  Eating habits: eating healthy  Falls/  MVA accidents in past few months: no  Regular exercise: yes swimming, walking and stretching exercises  Specialist pt sees on regular basis: no  Preventative health issues were discussed.   Additional concerns: does she need colonoscopy   Review of Systems     Objective:   Physical Exam        Assessment & Plan:

## 2021-09-03 NOTE — Patient Instructions (Signed)
Samul Dada for vaginal moisturizer  KY jelly for intercourse

## 2021-09-04 ENCOUNTER — Encounter: Payer: Self-pay | Admitting: Nurse Practitioner

## 2021-09-04 DIAGNOSIS — R922 Inconclusive mammogram: Secondary | ICD-10-CM | POA: Insufficient documentation

## 2021-09-04 DIAGNOSIS — N942 Vaginismus: Secondary | ICD-10-CM | POA: Insufficient documentation

## 2021-09-06 ENCOUNTER — Telehealth: Payer: Self-pay | Admitting: *Deleted

## 2021-09-06 NOTE — Chronic Care Management (AMB) (Signed)
  Chronic Care Management   Note  09/06/2021 Name: Susan Liu MRN: [id number] DOB: 1956-06-29  Susan Liu is a 65 y.o. year old female who is a primary care patient of Luking, Elayne Snare, MD. I reached out to Darryll Capers by phone today in response to a referral sent by Susan Liu's PCP.  Susan Liu was given information about Chronic Care Management services today including:  CCM service includes personalized support from designated clinical staff supervised by her physician, including individualized plan of care and coordination with other care providers 24/7 contact phone numbers for assistance for urgent and routine care needs. Service will only be billed when office clinical staff spend 20 minutes or more in a month to coordinate care. Only one practitioner may furnish and bill the service in a calendar month. The patient may stop CCM services at any time (effective at the end of the month) by phone call to the office staff. The patient is responsible for co-pay (up to 20% after annual deductible is met) if co-pay is required by the individual health plan.   Patient agreed to services and verbal consent obtained.   Follow up plan: Telephone appointment with care management team member scheduled for:09/13/21  [facility]  Direct Dial: [phone]

## 2021-09-07 ENCOUNTER — Encounter (INDEPENDENT_AMBULATORY_CARE_PROVIDER_SITE_OTHER): Payer: Self-pay | Admitting: *Deleted

## 2021-09-13 ENCOUNTER — Ambulatory Visit (INDEPENDENT_AMBULATORY_CARE_PROVIDER_SITE_OTHER): Payer: Medicare HMO | Admitting: Pharmacist

## 2021-09-13 DIAGNOSIS — E785 Hyperlipidemia, unspecified: Secondary | ICD-10-CM

## 2021-09-13 DIAGNOSIS — M810 Age-related osteoporosis without current pathological fracture: Secondary | ICD-10-CM

## 2021-09-13 NOTE — Patient Instructions (Signed)
Susan Liu,  It was great to talk to you today!  Please call me with any questions or concerns.   Visit Information   PATIENT GOALS:   Goals Addressed             This Visit's Progress    Medication Management       Patient Goals/Self-Care Activities Patient will:  Take medications as prescribed Engage in dietary modifications by decreased fat intake Get cholesterol panel checked in November 2022        Consent to CCM Services: Susan Liu was given information about Chronic Care Management services including:  CCM service includes personalized support from designated clinical staff supervised by her physician, including individualized plan of care and coordination with other care providers 24/7 contact phone numbers for assistance for urgent and routine care needs. Service will only be billed when office clinical staff spend 20 minutes or more in a month to coordinate care. Only one practitioner may furnish and bill the service in a calendar month. The patient may stop CCM services at any time (effective at the end of the month) by phone call to the office staff. The patient will be responsible for cost sharing (co-pay) of up to 20% of the service fee (after annual deductible is met).  Patient agreed to services and verbal consent obtained.   Patient verbalizes understanding of instructions provided today and agrees to view in [facility].   Telephone follow up appointment with care management team member scheduled for:11/05/21  Susan Liu, PharmD Clinical Pharmacist [facility] [phone]  CLINICAL CARE PLAN: Patient Care Plan: Medication Management     Problem Identified: Osteoporosis and Hyperlipidemia   Priority: High  Onset Date: 09/13/2021     Long-Range Goal: Disease Progression Prevention   Start Date: 09/13/2021  Expected End Date: 12/12/2021  This Visit's Progress: On track  Priority: High  Note:   Current Barriers:   Unable to achieve control of hyperlipidemia and osteoporosis  Pharmacist Clinical Goal(s):  Patient will achieve control of hyperlipidemia and osteoporosis as evidenced by improved LDL and improved bone mineral density through collaboration with PharmD and provider.   Interventions: 1:1 collaboration with Susan Drown, MD regarding development and update of comprehensive plan of care as evidenced by provider attestation and co-signature Inter-disciplinary care team collaboration (see longitudinal plan of care) Comprehensive medication review performed; medication list updated in electronic medical record  Hyperlipidemia - New goal.: Uncontrolled. LDL at goal of <100 due to moderate risk given <2 major CV risk factors (elevated LDL cholesterol) and 10-year risk <10% per 2020 AACE/ACE guidelines. Triglycerides at goal of <150 per 2020 AACE/ACE guidelines. Patient was taking pravastatin previously but LDL remained elevated Current medications: rosuvastatin 20 mg by mouth once daily Intolerances: none, patient complained of some diarrhea which was unsure if related to antibiotic therapy or statin. Patient discontinued statin for a few weeks. Has now been taking for about 3 weeks and is tolerating well. Taking medications as directed: yes Side effects thought to be attributed to current medication regimen: no Patient exercises frequently and is very active in general. Has worked on reducing fatty foods. Plans to seek help from dietician services to help with meal planning.  Continue rosuvastatin 20 mg by mouth once daily Encourage dietary reduction of high fat containing foods such as butter, nuts, bacon, egg yolks, etc. Recommend regular aerobic exercise Re-check lipid panel in 4-12 weeks If LDL remains above goal, will consider addition of ezetimibe 10 mg by mouth  daily  Osteoporosis - New goal. Appropriately managed. Followed by Dr. Buddy Liu (endocrinology) Current medications: calcium +  vitamin D  History of taking alendronate. Currently on bisphosphonate holiday.  Intolerances: none Taking medications as directed: yes Side effects thought to be attributed to current medication regimen: no Last bone density scan (07/17/20): Femur neck right T -2.7, femur total mean T -1.9  Falls reported in last year: 0 Current exercise:  very active, resistance training Recommend 1,000 mg of elemental calcium per day from diet or supplementation. Supplemental calcium not necessary if appropriate amount obtained through diet. Recommend 800 - 2,000 IU of vitamin D supplementation per day to maintain adequate vitamin D levels Recommend walking, low impact aerobic exercise, or strength training Continue management per endocrinology  Patient Goals/Self-Care Activities Patient will:  Take medications as prescribed Engage in dietary modifications by decreased fat intake Get cholesterol panel checked in November 2022  Follow Up Plan: Telephone follow up appointment with care management team member scheduled for: 11/05/21

## 2021-09-13 NOTE — Chronic Care Management (AMB) (Signed)
Chronic Care Management Pharmacy Note  09/13/2021 Name:  Susan Liu MRN:  [id number] DOB:  30-Jan-1956  Summary:  Hyperlipidemia Patient complained of some diarrhea which was unsure if related to antibiotic therapy or statin. Patient discontinued statin for a few weeks. Has now been taking for about 3 weeks and is tolerating well. Patient exercises frequently and is very active in general. Has worked on reducing fatty foods. Plans to seek help from dietician services to help with meal planning.  Continue rosuvastatin 20 mg by mouth once daily. Plan to check lipid panel again in ~6 weeks. If LDL remains above goal, will consider addition of ezetimibe 10 mg by mouth daily  Osteoporosis Continue management per endocrinology (Dr. Buddy Duty) Current medications: calcium + vitamin D  History of taking alendronate. Currently on bisphosphonate holiday.  Recommend 1,000 mg of elemental calcium per day from diet or supplementation. Supplemental calcium not necessary if appropriate amount obtained through diet. Recommend 800 - 2,000 IU of vitamin D supplementation per day to maintain adequate vitamin D levels Recommend walking, low impact aerobic exercise, or strength training  Subjective: Susan Liu is an 65 y.o. year old female who is a primary patient of Luking, Elayne Snare, MD.  The CCM team was consulted for assistance with disease management and care coordination needs.    Engaged with patient by telephone for initial visit in response to provider referral for pharmacy case management and/or care coordination services.   Consent to Services:  The patient was given the following information about Chronic Care Management services today, agreed to services, and gave verbal consent: 1. CCM service includes personalized support from designated clinical staff supervised by the primary care provider, including individualized plan of care and coordination with other care providers 2. 24/7 contact  phone numbers for assistance for urgent and routine care needs. 3. Service will only be billed when office clinical staff spend 20 minutes or more in a month to coordinate care. 4. Only one practitioner may furnish and bill the service in a calendar month. 5.The patient may stop CCM services at any time (effective at the end of the month) by phone call to the office staff. 6. The patient will be responsible for cost sharing (co-pay) of up to 20% of the service fee (after annual deductible is met). Patient agreed to services and consent obtained.  Patient Care Team: Kathyrn Drown, MD as PCP - General (Family Medicine) Beryle Lathe, [facility] (Pharmacist)  Objective:  Lab Results  Component Value Date   CREATININE 0.86 06/11/2021    No results found for: HGBA1C Last diabetic Eye exam: No results found for: HMDIABEYEEXA  Last diabetic Foot exam: No results found for: HMDIABFOOTEX      Component Value Date/Time   CHOL 249 (H) 06/11/2021 0811   TRIG 93 06/11/2021 0811   HDL 79 06/11/2021 0811   CHOLHDL 3.2 06/11/2021 0811   LDLCALC 154 (H) 06/11/2021 0811    Hepatic Function Latest Ref Rng & Units 06/11/2021  Total Protein 6.0 - 8.5 g/dL 6.9  Albumin 3.8 - 4.8 g/dL 4.6  AST 0 - 40 IU/L 23  ALT 0 - 32 IU/L 17  Alk Phosphatase 44 - 121 IU/L 67  Total Bilirubin 0.0 - 1.2 mg/dL 0.6  Bilirubin, Direct 0.00 - 0.40 mg/dL 0.15    No results found for: TSH, FREET4  CBC Latest Ref Rng & Units 05/06/2020  WBC 3.4 - 10.8 x10E3/uL 7.5  Hemoglobin 11.1 - 15.9 g/dL  13.1  Hematocrit 34.0 - 46.6 % 40.9  Platelets 150 - 450 x10E3/uL 398    Lab Results  Component Value Date/Time   VD25OH 42.0 06/11/2021 08:11 AM    Clinical ASCVD: No  The 10-year ASCVD risk score (Arnett DK, et al., 2019) is: 5.9%   Values used to calculate the score:     Age: 65 years     Sex: Female     Is Non-Hispanic African American: No     Diabetic: No     Tobacco smoker: No     Systolic Blood Pressure: 355  mmHg     Is BP treated: No     HDL Cholesterol: 79 mg/dL     Total Cholesterol: 249 mg/dL    Social History   Tobacco Use  Smoking Status Never  Smokeless Tobacco Never   BP Readings from Last 3 Encounters:  09/03/21 134/72  06/22/21 114/74  01/06/21 117/69   Pulse Readings from Last 3 Encounters:  06/28/21 82  06/22/21 75  01/06/21 74   Wt Readings from Last 3 Encounters:  09/03/21 118 lb 6.4 oz (53.7 kg)  06/28/21 119 lb (54 kg)  01/06/21 120 lb (54.4 kg)    Assessment: Review of patient past medical history, allergies, medications, health status, including review of consultants reports, laboratory and other test data, was performed as part of comprehensive evaluation and provision of chronic care management services.   SDOH:  (Social Determinants of Health) assessments and interventions performed:    CCM Care Plan  Allergies  Allergen Reactions   Erythromycin Itching    Medications Reviewed Today     Reviewed by Beryle Lathe, [facility] (Pharmacist) on 09/13/21 at 1252  Med List Status: <None>   Medication Order Taking? Sig Documenting Provider Last Dose Status Informant  Acetaminophen (ACETAMIN PO) [id number] Yes Take 500 mg by mouth every 8 (eight) hours as needed. [provider] Taking Active   Ascorbic Acid (VITAMIN C PO) [id number] Yes Take by mouth. [provider] Taking Active   Calcium Carb-Cholecalciferol (CALTRATE 600+D3 PO) [id number] Yes Take 1 tablet by mouth daily. [provider] Taking Active   Creatine Monohydrate POWD [id number] Yes by Does not apply route. [provider] Taking Active   Glucos-Chondroit-Hyaluron-MSM (GLUCOSAMINE CHONDROITIN JOINT PO) [id number] Yes Take 1 tablet by mouth daily. [provider] Taking Active   ibuprofen (ADVIL) 200 MG tablet [id number] Yes Take 200 mg by mouth every 6 (six) hours as needed. [provider] Taking Active   LYSINE PO [id number] Yes Take by mouth.  [provider] Taking Active   Menthol-Camphor ([place]) [id number] Yes Apply topically. [provider] Taking Active   Methylsulfonylmethane (MSM PO) [id number] Yes Take by mouth. [provider] Taking Active   Multiple Vitamins-Minerals (ONE-A-DAY WOMENS PO) [id number] Yes Take 1 tablet by mouth daily. [provider] Taking Active   Omega-3 Fatty Acids (FISH OIL PO) [id number] Yes Take 1 capsule by mouth daily. [provider] Taking Active   rosuvastatin (CRESTOR) 20 MG tablet [id number] Yes Take 1 tablet (20 mg total) by mouth daily. 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   Spasm of muscle 07/29/2014   Stiffness of joints, not elsewhere classified, multiple sites 07/29/2014   Osteopenia 06/05/2014   Cervical nerve root compression 06/05/2014   Osteoporosis, unspecified 05/24/2013   Hyperlipemia 05/24/2013   Migraines 05/03/2013    Immunization History  Administered Date(s) Administered   DT (Pediatric) 06/13/2013   Fluad Quad(high Dose 65+) 09/03/2021   Influenza-Unspecified 08/29/2011, 08/28/2014, 10/11/2016, 08/21/2020   Moderna Sars-Covid-2 Vaccination 03/05/2020, 04/07/2020   PNEUMOCOCCAL CONJUGATE-20 06/16/2021   Zoster Recombinat (Shingrix) 11/10/2020, 02/02/2021   Zoster, Live 07/15/2014    Conditions to be addressed/monitored: HLD and Osteoporosis  Care Plan : Medication Management  Updates made by Beryle Lathe,  [place] since 09/13/2021 12:00 AM     Problem: Osteoporosis and Hyperlipidemia   Priority: High  Onset Date: 09/13/2021     Long-Range Goal: Disease Progression Prevention   Start Date: 09/13/2021  Expected End Date: 12/12/2021  This Visit's Progress: On track  Priority: High  Note:   Current Barriers:  Unable to achieve control of hyperlipidemia and osteoporosis  Pharmacist Clinical Goal(s):  Patient will achieve control of hyperlipidemia and osteoporosis as evidenced by improved LDL and improved bone mineral density through collaboration with PharmD and provider.   Interventions: 1:1 collaboration with Kathyrn Drown, MD regarding development and update of comprehensive plan of care as evidenced by provider attestation and co-signature Inter-disciplinary care team collaboration (see longitudinal plan of care) Comprehensive medication review performed; medication list updated in electronic medical record  Hyperlipidemia - New goal.: Uncontrolled. LDL at goal of <100 due to moderate risk given <2 major CV risk factors (elevated LDL cholesterol) and 10-year risk <10% per 2020 AACE/ACE guidelines. Triglycerides at goal of <150 per 2020 AACE/ACE guidelines. Patient was taking pravastatin previously but LDL remained elevated Current medications: rosuvastatin 20 mg by mouth once daily Intolerances: none, patient complained of some diarrhea which was unsure if related to antibiotic therapy or statin. Patient discontinued statin for a few weeks. Has now been taking for about 3 weeks and is tolerating well. Taking medications as directed: yes Side effects thought to be attributed to current medication regimen: no Patient exercises frequently and is very active in general. Has worked on reducing fatty foods. Plans to seek help from dietician services to help with meal planning.  Continue rosuvastatin 20 mg by mouth once daily Encourage dietary reduction of high fat containing foods such as  butter, nuts, bacon, egg yolks, etc. Recommend regular aerobic exercise Re-check lipid panel in 4-12 weeks If LDL remains above goal, will consider addition of ezetimibe 10 mg by mouth daily  Osteoporosis - New goal. Appropriately managed. Followed by Dr. Buddy Duty (endocrinology) Current medications: calcium + vitamin D  History of taking alendronate. Currently on bisphosphonate holiday.  Intolerances: none Taking medications as directed: yes Side effects thought to be attributed to current medication regimen: no Last bone density scan (07/17/20): Femur neck right T -2.7, femur total mean T -1.9  Falls reported in last year: 0 Current exercise:  very active, resistance training Recommend 1,000 mg of elemental calcium per day from diet or supplementation. Supplemental calcium not necessary if appropriate amount obtained through diet. Recommend 800 - 2,000 IU of vitamin D supplementation per day to maintain adequate vitamin D levels Recommend walking, low impact aerobic  exercise, or strength training Continue management per endocrinology  Patient Goals/Self-Care Activities Patient will:  Take medications as prescribed Engage in dietary modifications by decreased fat intake Get cholesterol panel checked in November 2022  Follow Up Plan: Telephone follow up appointment with care management team member scheduled for: 11/05/21      Medication Assistance: None required.  Patient affirms current coverage meets needs.  Patient's preferred pharmacy is:  [facility] [place], [place] [address] Phone: [phone] Fax: [place], [place] [place] [address] Phone: [phone] Fax: [phone]  [facility] [phone] - [place], [address] AT [address] [place] [address] Phone:  [phone] Fax: [phone]  Follow Up:  Patient agrees to Care Plan and Follow-up.  Plan: Telephone follow up appointment with care management team member scheduled for:  11/05/21  Kennon Holter, PharmD Clinical Pharmacist [place] [phone]

## 2021-09-27 DIAGNOSIS — M810 Age-related osteoporosis without current pathological fracture: Secondary | ICD-10-CM | POA: Diagnosis not present

## 2021-09-27 DIAGNOSIS — E785 Hyperlipidemia, unspecified: Secondary | ICD-10-CM

## 2021-10-14 ENCOUNTER — Telehealth (INDEPENDENT_AMBULATORY_CARE_PROVIDER_SITE_OTHER): Payer: Self-pay

## 2021-10-14 ENCOUNTER — Other Ambulatory Visit (INDEPENDENT_AMBULATORY_CARE_PROVIDER_SITE_OTHER): Payer: Self-pay

## 2021-10-14 ENCOUNTER — Encounter (INDEPENDENT_AMBULATORY_CARE_PROVIDER_SITE_OTHER): Payer: Self-pay

## 2021-10-14 DIAGNOSIS — Z1211 Encounter for screening for malignant neoplasm of colon: Secondary | ICD-10-CM

## 2021-10-14 MED ORDER — PEG 3350-KCL-NA BICARB-NACL 420 G PO SOLR: 4000.0000 mL | ORAL | 0 refills | Status: DC

## 2021-10-14 NOTE — Telephone Encounter (Signed)
Referring MD/PCP: Luking  Procedure: Tcs  Reason/Indication:  Screening   Has patient had this procedure before?  yes  If so, when, by whom and where?  2008  Is there a family history of colon cancer?  no  Who?  What age when diagnosed?    Is patient diabetic? If yes, Type 1 or Type 2   no      Does patient have prosthetic heart valve or mechanical valve?  no  Do you have a pacemaker/defibrillator?  no  Has patient ever had endocarditis/atrial fibrillation? no  Does patient use oxygen? no  Has patient had joint replacement within last 12 months?  no  Is patient constipated or do they take laxatives? no  Does patient have a history of alcohol/drug use?  no  Have you had a stroke/heart attack last 6 mths? no  Do you take medicine for weight loss?  no  For female patients,: do you still have your menstrual cycle? no  Is patient on blood thinner such as Coumadin, Plavix and/or Aspirin? no  Medications: rosuvastatin 20 mg daily, sumtriptan 100 mg prn, MVI daily, caltrate 600+D3 daily, glucosamine daily, omega 3 fatty acids  Allergies: erythromycin   Medication Adjustment per Dr Jenetta Downer none  Procedure date & time: Wednesday 11/10/21 at 8:30 am

## 2021-10-14 NOTE — Telephone Encounter (Signed)
Susan Liu, CMA  

## 2021-10-29 LAB — LIPID PANEL
Chol/HDL Ratio: 2.5 ratio (ref 0.0–4.4)
Cholesterol, Total: 187 mg/dL (ref 100–199)
HDL: 76 mg/dL (ref >39)
LDL Chol Calc (NIH): 95 mg/dL (ref 0–99)
Triglycerides: 91 mg/dL (ref 0–149)
VLDL Cholesterol Cal: 16 mg/dL (ref 5–40)

## 2021-10-29 LAB — HEPATIC FUNCTION PANEL
ALT: 30 IU/L (ref 0–32)
AST: 32 IU/L (ref 0–40)
Albumin: 4.4 g/dL (ref 3.8–4.8)
Alkaline Phosphatase: 65 IU/L (ref 44–121)
Bilirubin Total: 0.4 mg/dL (ref 0.0–1.2)
Bilirubin, Direct: 0.14 mg/dL (ref 0.00–0.40)
Total Protein: 6.5 g/dL (ref 6.0–8.5)

## 2021-11-05 ENCOUNTER — Telehealth: Payer: Medicare HMO

## 2021-11-09 ENCOUNTER — Other Ambulatory Visit: Payer: Self-pay

## 2021-11-09 ENCOUNTER — Inpatient Hospital Stay (HOSPITAL_COMMUNITY)
Admission: RE | Admit: 2021-11-09 | Discharge: 2021-11-09 | Disposition: A | Discharge status: Home / Self Care | Payer: Medicare HMO | Source: Ambulatory Visit

## 2021-11-09 ENCOUNTER — Encounter (HOSPITAL_COMMUNITY): Payer: Self-pay

## 2021-11-09 HISTORY — DX: Unspecified osteoarthritis, unspecified site: M19.90

## 2021-11-10 ENCOUNTER — Ambulatory Visit (HOSPITAL_COMMUNITY)
Admission: RE | Admit: 2021-11-10 | Discharge: 2021-11-10 | Disposition: A | Discharge status: Home / Self Care | Payer: Medicare HMO | Attending: Gastroenterology | Admitting: Gastroenterology

## 2021-11-10 ENCOUNTER — Ambulatory Visit (HOSPITAL_COMMUNITY): Payer: Medicare HMO | Admitting: Anesthesiology

## 2021-11-10 ENCOUNTER — Encounter (HOSPITAL_COMMUNITY): Payer: Self-pay | Admitting: Gastroenterology

## 2021-11-10 ENCOUNTER — Encounter (HOSPITAL_COMMUNITY)
Admission: RE | Disposition: A | Discharge status: Home / Self Care | Payer: Self-pay | Source: Home / Self Care | Attending: Gastroenterology

## 2021-11-10 ENCOUNTER — Other Ambulatory Visit: Payer: Self-pay

## 2021-11-10 DIAGNOSIS — K635 Polyp of colon: Secondary | ICD-10-CM | POA: Diagnosis not present

## 2021-11-10 DIAGNOSIS — G709 Myoneural disorder, unspecified: Secondary | ICD-10-CM | POA: Diagnosis not present

## 2021-11-10 DIAGNOSIS — M810 Age-related osteoporosis without current pathological fracture: Secondary | ICD-10-CM | POA: Diagnosis not present

## 2021-11-10 DIAGNOSIS — D120 Benign neoplasm of cecum: Secondary | ICD-10-CM | POA: Insufficient documentation

## 2021-11-10 DIAGNOSIS — D123 Benign neoplasm of transverse colon: Secondary | ICD-10-CM

## 2021-11-10 DIAGNOSIS — Z1211 Encounter for screening for malignant neoplasm of colon: Secondary | ICD-10-CM | POA: Diagnosis present

## 2021-11-10 DIAGNOSIS — M1990 Unspecified osteoarthritis, unspecified site: Secondary | ICD-10-CM | POA: Diagnosis not present

## 2021-11-10 DIAGNOSIS — E785 Hyperlipidemia, unspecified: Secondary | ICD-10-CM | POA: Diagnosis not present

## 2021-11-10 HISTORY — PX: POLYPECTOMY: SHX5525

## 2021-11-10 HISTORY — PX: COLONOSCOPY WITH PROPOFOL: SHX5780

## 2021-11-10 LAB — HM COLONOSCOPY

## 2021-11-10 SURGERY — COLONOSCOPY WITH PROPOFOL
Anesthesia: General

## 2021-11-10 MED ORDER — PROPOFOL 500 MG/50ML IV EMUL
INTRAVENOUS | Status: DC | PRN
  Administered 2021-11-10: 150 ug/kg/min via INTRAVENOUS

## 2021-11-10 MED ORDER — STERILE WATER FOR IRRIGATION IR SOLN
Status: DC | PRN
  Administered 2021-11-10: 180 mL

## 2021-11-10 MED ORDER — LACTATED RINGERS IV SOLN: INTRAVENOUS | Status: DC

## 2021-11-10 MED ORDER — PROPOFOL 1000 MG/100ML IV EMUL
INTRAVENOUS | Status: AC
  Filled 2021-11-10: qty 100

## 2021-11-10 MED ORDER — PROPOFOL 1000 MG/100ML IV EMUL
INTRAVENOUS | Status: AC
  Filled 2021-11-10: qty 200

## 2021-11-10 MED ORDER — PROPOFOL 10 MG/ML IV BOLUS
INTRAVENOUS | Status: DC | PRN
  Administered 2021-11-10: 80 mg via INTRAVENOUS

## 2021-11-10 NOTE — Transfer of Care (Signed)
Immediate Anesthesia Transfer of Care Note  Patient: Susan Liu  Procedure(s) Performed: COLONOSCOPY WITH PROPOFOL POLYPECTOMY  Patient Location: Short Stay  Anesthesia Type:General  Level of Consciousness: awake, alert  and oriented  Airway & Oxygen Therapy: Patient Spontanous Breathing  Post-op Assessment: Report given to RN and Post -op Vital signs reviewed and stable  Post vital signs: Reviewed and stable  Last Vitals:  Vitals Value Taken Time  BP 109/77 11/10/21 0848  Temp 36.7 C 11/10/21 0846  Pulse    Resp 16 11/10/21 0846  SpO2      Last Pain:  Vitals:   11/10/21 0846  TempSrc: Axillary  PainSc: 0-No pain         Complications: No notable events documented.

## 2021-11-10 NOTE — Anesthesia Preprocedure Evaluation (Signed)
Anesthesia Evaluation  Patient identified by MRN, date of birth, ID band Patient awake    Reviewed: Allergy & Precautions, H&P , NPO status , Patient's Chart, lab work & pertinent test results  Airway Mallampati: II  TM Distance: >3 FB Neck ROM: Full    Dental  (+) Dental Advisory Given Crowns :   Pulmonary neg pulmonary ROS,    Pulmonary exam normal breath sounds clear to auscultation       Cardiovascular + Valvular Problems/Murmurs  Rhythm:Regular Rate:Normal + Systolic murmurs    Neuro/Psych  Headaches,  Neuromuscular disease negative psych ROS   GI/Hepatic negative GI ROS, Neg liver ROS,   Endo/Other  negative endocrine ROS  Renal/GU negative Renal ROS  negative genitourinary   Musculoskeletal  (+) Arthritis , Osteoarthritis,    Abdominal   Peds negative pediatric ROS (+)  Hematology negative hematology ROS (+)   Anesthesia Other Findings   Reproductive/Obstetrics negative OB ROS                            Anesthesia Physical Anesthesia Plan  ASA: 2  Anesthesia Plan: General   Post-op Pain Management: Minimal or no pain anticipated   Induction:   PONV Risk Score and Plan: Treatment may vary due to age or medical condition  Airway Management Planned: Nasal Cannula and Natural Airway  Additional Equipment:   Intra-op Plan:   Post-operative Plan:   Informed Consent: I have reviewed the patients History and Physical, chart, labs and discussed the procedure including the risks, benefits and alternatives for the proposed anesthesia with the patient or authorized representative who has indicated his/her understanding and acceptance.     Dental advisory given  Plan Discussed with: CRNA and Surgeon  Anesthesia Plan Comments:         Anesthesia Quick Evaluation

## 2021-11-10 NOTE — Op Note (Addendum)
[facility] Patient Name: Susan Liu Procedure Date: 11/10/2021 8:01 AM MRN: [id number] Date of Birth: 1956-11-24 Attending MD: Maylon Peppers ,  CSN: [id number] Age: 65 Admit Type: Outpatient Procedure:                Colonoscopy Indications:              Screening for colorectal malignant neoplasm Providers:                Maylon Peppers, Lurline Del, RN, Kristine L. Risa Grill, Technician Referring MD:              Medicines:                Monitored Anesthesia Care Complications:            No immediate complications. Estimated Blood Loss:     Estimated blood loss: none. Procedure:                Pre-Anesthesia Assessment:                           - Prior to the procedure, a History and Physical                            was performed, and patient medications, allergies                            and sensitivities were reviewed. The patient's                            tolerance of previous anesthesia was reviewed.                           - The risks and benefits of the procedure and the                            sedation options and risks were discussed with the                            patient. All questions were answered and informed                            consent was obtained.                           - ASA Grade Assessment: II - A patient with mild                            systemic disease.                           After obtaining informed consent, the colonoscope                            was passed under direct vision. Throughout the  procedure, the patient's blood pressure, pulse, and                            oxygen saturations were monitored continuously. The                            [id number]-[id number] ([id number]) scope was introduced through                            the anus and advanced to the the cecum, identified                            by appendiceal orifice and ileocecal valve. The                             colonoscopy was performed without difficulty. The                            patient tolerated the procedure well. The quality                            of the bowel preparation was excellent. Scope In: 8:12:54 AM Scope Out: 8:42:51 AM Scope Withdrawal Time: 0 hours 25 minutes 35 seconds  Total Procedure Duration: 0 hours 29 minutes 57 seconds  Findings:      The perianal and digital rectal examinations were normal.      Two flat polyps were found in the cecum. The polyps were 4 to 5 mm in       size. Area was successfully injected with 2 mL Eleview for a lift       polypectomy of both polyps. Imaging was performed using white light and       narrow band imaging to visualize the mucosa and demarcate the polyp site       after injection for EMR purposes. These polyps were removed with a cold       snare. Resection and retrieval were complete.      A 4 mm polyp was found in the descending colon. The polyp was flat. The       polyp was removed with a cold snare. Resection and retrieval were       complete.      The retroflexed view of the distal rectum and anal verge was normal and       showed no anal or rectal abnormalities. Impression:               - Two 4 to 5 mm polyps in the cecum, removed with a                            cold snare. Resected and retrieved. Injected.                           - One 4 mm polyp in the descending colon, removed                            with a cold snare. Resected and retrieved.                           -  The distal rectum and anal verge are normal on                            retroflexion view. Moderate Sedation:      Per Anesthesia Care Recommendation:           - Discharge patient to home (ambulatory).                           - Resume previous diet.                           - Await pathology results.                           - Repeat colonoscopy date to be determined after                            pending  pathology results are reviewed for                            surveillance. Procedure Code(s):        --- Professional ---                           [id number], 59, Colonoscopy, flexible; with removal of                            tumor(s), polyp(s), or other lesion(s) by snare                            technique                           45381, Colonoscopy, flexible; with directed                            submucosal injection(s), any substance Diagnosis Code(s):        --- Professional ---                           K63.5, Polyp of colon                           Z12.11, Encounter for screening for malignant                            neoplasm of colon CPT copyright 2019 American Medical Association. All rights reserved. The codes documented in this report are preliminary and upon coder review may  be revised to meet current compliance requirements. Maylon Peppers, MD Maylon Peppers,  11/10/2021 8:52:15 AM This report has been signed electronically. Number of Addenda: 0

## 2021-11-10 NOTE — Addendum Note (Signed)
Addendum  created 11/10/21 1106 by Denese Killings, MD   Clinical Note Signed

## 2021-11-10 NOTE — Anesthesia Postprocedure Evaluation (Addendum)
Anesthesia Post Note  Patient: Susan Liu  Procedure(s) Performed: COLONOSCOPY WITH PROPOFOL POLYPECTOMY  Patient location during evaluation: Phase II Anesthesia Type: General Level of consciousness: awake and alert and oriented Pain management: pain level controlled Vital Signs Assessment: post-procedure vital signs reviewed and stable Respiratory status: spontaneous breathing, nonlabored ventilation and respiratory function stable Cardiovascular status: blood pressure returned to baseline and stable Postop Assessment: no apparent nausea or vomiting Anesthetic complications: no   No notable events documented.   Last Vitals:  Vitals:   11/10/21 0846 11/10/21 0848  BP:  109/77  Pulse:    Resp: 16   Temp: 36.7 C   SpO2:      Last Pain:  Vitals:   11/10/21 0846  TempSrc: Axillary  PainSc: 0-No pain                 Min Tunnell C Daneesha Quinteros

## 2021-11-10 NOTE — H&P (Signed)
Susan Liu is an 65 y.o. female.   Chief Complaint: screening colonoscopy HPI: 65 year old female with past medical history of hyperlipidemia, osteoporosis, arthritis, coming for screening colonoscopy.  States her last colonoscopy was performed in 2008 but no reports are available, reports this was normal.  The patient denies having any complaints such as melena, hematochezia, abdominal pain or distention, change in her bowel movement consistency or frequency, no changes in her weight recently.  No family history of colorectal cancer.   Past Medical History:  Diagnosis Date   Arthritis    Heart murmur 1993   Hyperlipidemia    Migraine    Osteoporosis     Past Surgical History:  Procedure Laterality Date   CESAREAN SECTION     x2   COLONOSCOPY  2008   Dr.Rourk-normal exam    Family History  Problem Relation Age of Onset   Cancer Mother        Lung   Colon polyps Mother    Heart attack Father    Colon cancer Neg Hx    Esophageal cancer Neg Hx    Rectal cancer Neg Hx    Stomach cancer Neg Hx    Social History:  reports that she has never smoked. She has never used smokeless tobacco. She reports that she does not currently use alcohol. She reports that she does not currently use drugs.  Allergies:  Allergies  Allergen Reactions   Erythromycin Itching    Medications Prior to Admission  Medication Sig Dispense Refill   Ascorbic Acid (VITAMIN C) POWD Take 0.5 Scoops by mouth 4 (four) times a week.     Calcium Carb-Cholecalciferol (CALTRATE 600+D3 PO) Take 1 tablet by mouth daily.     Creatine Monohydrate POWD Take 0.5 Scoops by mouth 4 (four) times a week.     doxylamine, Sleep, (UNISOM) 25 MG tablet Take 12.5 mg by mouth at bedtime as needed for sleep.     Glucosamine-Chondroit-Vit C-Mn (GLUCOSAMINE 1500 COMPLEX PO) Take 1,500 mg by mouth daily.     ibuprofen (ADVIL) 200 MG tablet Take 400 mg by mouth every 6 (six) hours as needed for moderate pain or headache.      Lysine HCl POWD Take 1,000 mg by mouth 4 (four) times a week.     Menthol (ICY HOT) 5 % PTCH Apply 1 patch topically daily as needed (pain).     Methylsulfonylmethane (MSM PO) Take 1 Dose by mouth 4 (four) times a week. 1 dose = 1 teaspoonful     Multiple Vitamins-Minerals (ONE-A-DAY WOMENS PO) Take 1 tablet by mouth daily.     Omega-3 Fatty Acids (FISH OIL) 1200 MG CAPS Take 1,200 mg by mouth daily.     phenylephrine (SUDAFED PE) 10 MG TABS tablet Take 10 mg by mouth every 4 (four) hours as needed (sinus headaches).     polyethylene glycol-electrolytes (TRILYTE) 420 g solution Take 4,000 mLs by mouth as directed. 4000 mL 0   rosuvastatin (CRESTOR) 20 MG tablet Take 1 tablet (20 mg total) by mouth daily. 90 tablet 1   SUMAtriptan (IMITREX) 100 MG tablet TAKE ONE TABLET BY MOUTH EVERY DAY AS NEEDED 9 tablet 5   triamcinolone (NASACORT) 55 MCG/ACT AERO nasal inhaler Place 2 sprays into the nose daily.     vitamin C (ASCORBIC ACID) 500 MG tablet Take 1,000 mg by mouth 3 (three) times a week.      No results found for this or any previous visit (from the past 48  hour(s)). No results found.  Review of Systems  Constitutional: Negative.   HENT: Negative.    Eyes: Negative.   Respiratory: Negative.    Cardiovascular: Negative.   Gastrointestinal: Negative.   Endocrine: Negative.   Genitourinary: Negative.   Musculoskeletal: Negative.   Skin: Negative.   Allergic/Immunologic: Negative.   Neurological: Negative.   Hematological: Negative.   Psychiatric/Behavioral: Negative.     Blood pressure 131/66, pulse 91, temperature 97.6 F (36.4 C), temperature source Oral, resp. rate 17, SpO2 100 %. Physical Exam  GENERAL: The patient is AO x3, in no acute distress. HEENT: Head is normocephalic and atraumatic. EOMI are intact. Mouth is well hydrated and without lesions. NECK: Supple. No masses LUNGS: Clear to auscultation. No presence of rhonchi/wheezing/rales. Adequate chest expansion HEART:  RRR, normal s1 and s2. ABDOMEN: Soft, nontender, no guarding, no peritoneal signs, and nondistended. BS +. No masses. EXTREMITIES: Without any cyanosis, clubbing, rash, lesions or edema. NEUROLOGIC: AOx3, no focal motor deficit. SKIN: no jaundice, no rashes  Assessment/Plan 65 year old female with past medical history of hyperlipidemia, osteoporosis, arthritis, coming for screening colonoscopy. The patient is at average risk for colorectal cancer.  We will proceed with colonoscopy today.   Harvel Quale, MD 11/10/2021, 8:01 AM

## 2021-11-10 NOTE — Discharge Instructions (Signed)
You are being discharged to home.  Resume your previous diet.  We are waiting for your pathology results.  Your physician has recommended a repeat colonoscopy (date to be determined after pending pathology results are reviewed) for surveillance.  

## 2021-11-11 LAB — SURGICAL PATHOLOGY

## 2021-11-15 ENCOUNTER — Encounter (INDEPENDENT_AMBULATORY_CARE_PROVIDER_SITE_OTHER): Payer: Self-pay | Admitting: *Deleted

## 2021-11-15 ENCOUNTER — Encounter (HOSPITAL_COMMUNITY): Payer: Self-pay | Admitting: Gastroenterology

## 2021-12-02 ENCOUNTER — Ambulatory Visit (INDEPENDENT_AMBULATORY_CARE_PROVIDER_SITE_OTHER): Payer: Medicare HMO | Admitting: Family Medicine

## 2021-12-02 ENCOUNTER — Other Ambulatory Visit: Payer: Self-pay

## 2021-12-02 ENCOUNTER — Telehealth: Payer: Self-pay | Admitting: Family Medicine

## 2021-12-02 DIAGNOSIS — U071 COVID-19: Secondary | ICD-10-CM | POA: Diagnosis not present

## 2021-12-02 MED ORDER — NIRMATRELVIR/RITONAVIR (PAXLOVID)TABLET: 3.0000 | ORAL_TABLET | Freq: Two times a day (BID) | ORAL | 0 refills | Status: AC

## 2021-12-02 NOTE — Progress Notes (Signed)
° °  Subjective:    Patient ID: Susan Liu, female    DOB: Apr 07, 1956, 66 y.o.   MRN: [id number]  HPI  Virtual Visit via Video Note  I connected with Darryll Capers on 12/04/21 at 11:20 AM EST by a video enabled telemedicine application and verified that I am speaking with the correct person using two identifiers.  Location: Patient: Home Provider: Offsite location   I discussed the limitations of evaluation and management by telemedicine and the availability of in person appointments. The patient expressed understanding and agreed to proceed.  History of Present Illness:    Observations/Objective:   Assessment and Plan:   Follow Up Instructions:    I discussed the assessment and treatment plan with the patient. The patient was provided an opportunity to ask questions and all were answered. The patient agreed with the plan and demonstrated an understanding of the instructions.   The patient was advised to call back or seek an in-person evaluation if the symptoms worsen or if the condition fails to improve as anticipated.  I provided 12 minutes of non-face-to-face time during this encounter.   Sallee Lange, MD  Covid positive  Patient presents today with respiratory illness Number of days present-4 to 5 with reg virus but 24 hours with covid sx  Symptoms include- sore throat cough runny nose no doe no sob  Presence of worrisome signs (severe shortness of breath, lethargy, etc.) - nope  Recent/current visit to urgent care or ER- none  Recent direct exposure to Covid- yes  Any current Covid testing- home test positive   Review of Systems     Objective:   Physical Exam  Today's visit was via telephone Physical exam was not possible for this visit       Assessment & Plan:   Covid infection This is a viral process.  Mild cases are treated with supportive measures at home such as Tylenol rest fluids.  In some situations monoclonal antibodies may be  appropriate depending on the patient's risk criteria.  The patient was educated regarding progressive illness including respiratory, persistent vomiting, change in mental status.  If any of these occur ER evaluation is recommended. Patient was educated about the following as well Covid-19 respiratory warning: Covid-19 is a virus that causes hypoxia (low oxygen level in blood) in some people. If you develop any changes in your usual breathing pattern: difficulty catching your breath, more short winded with activity or with resting, or anything that concerns you about your breathing, do not hesitate to go to the emergency department immediately for evaluation. Please do not delay to get treatment.   Agrees with plan of care discussed today. Understands warning signs to seek further care: Chest pain, shortness of breath, mental confusion, profuse vomiting, any significant change in health. Understands to follow-up if symptoms do not improve, or worsen.    Paxlovid described  GFR and meds reviewed

## 2021-12-02 NOTE — Telephone Encounter (Signed)
Patient is having cough and nasal congestion, some drainage. No shortness of breath or trouble breathing.

## 2021-12-02 NOTE — Telephone Encounter (Signed)
Pt husband called and said he and and his wife tested positive for Covid (home test) and asked if some medication can be called in to [facility]. [phone]

## 2021-12-03 NOTE — Telephone Encounter (Signed)
Virtual visit 1/6

## 2021-12-09 ENCOUNTER — Encounter: Payer: Self-pay | Admitting: Family Medicine

## 2022-01-17 ENCOUNTER — Other Ambulatory Visit: Payer: Self-pay | Admitting: Family Medicine

## 2022-02-10 ENCOUNTER — Other Ambulatory Visit: Payer: Self-pay | Admitting: Family Medicine

## 2022-02-22 ENCOUNTER — Ambulatory Visit: Payer: Medicare HMO

## 2022-07-28 ENCOUNTER — Other Ambulatory Visit (HOSPITAL_COMMUNITY): Payer: Self-pay | Admitting: Family Medicine

## 2022-07-28 DIAGNOSIS — Z1231 Encounter for screening mammogram for malignant neoplasm of breast: Secondary | ICD-10-CM

## 2022-08-02 ENCOUNTER — Other Ambulatory Visit: Payer: Self-pay | Admitting: Family Medicine

## 2022-08-09 ENCOUNTER — Other Ambulatory Visit (HOSPITAL_COMMUNITY): Payer: Self-pay | Admitting: Internal Medicine

## 2022-08-09 DIAGNOSIS — M810 Age-related osteoporosis without current pathological fracture: Secondary | ICD-10-CM

## 2022-08-10 ENCOUNTER — Ambulatory Visit (HOSPITAL_COMMUNITY)
Admission: RE | Admit: 2022-08-10 | Discharge: 2022-08-10 | Disposition: A | Discharge status: Home / Self Care | Payer: Medicare HMO | Source: Ambulatory Visit | Attending: Family Medicine | Admitting: Family Medicine

## 2022-08-10 DIAGNOSIS — Z1231 Encounter for screening mammogram for malignant neoplasm of breast: Secondary | ICD-10-CM | POA: Insufficient documentation

## 2022-08-25 ENCOUNTER — Ambulatory Visit (HOSPITAL_COMMUNITY)
Admission: RE | Admit: 2022-08-25 | Discharge: 2022-08-25 | Disposition: A | Discharge status: Home / Self Care | Payer: Medicare HMO | Source: Ambulatory Visit | Attending: Internal Medicine | Admitting: Internal Medicine

## 2022-08-25 DIAGNOSIS — M810 Age-related osteoporosis without current pathological fracture: Secondary | ICD-10-CM | POA: Diagnosis not present

## 2022-09-01 ENCOUNTER — Telehealth: Payer: Self-pay | Admitting: Family Medicine

## 2022-09-01 NOTE — Telephone Encounter (Signed)
  Left message for patient to call back and schedule Medicare Annual Wellness Visit (AWV).  Please offer to do virtually or by telephone.  No hx of AWV eligible for AWVI per [facility] as of 11201/2022  Please schedule at any time with [facility] Health Advisor.      45 minute appointment   Any questions, please call me at [phone]

## 2022-09-05 ENCOUNTER — Telehealth: Payer: Self-pay

## 2022-09-05 NOTE — Telephone Encounter (Signed)
Patient returned call and states she prefers her AWV in person with Ms Hoyle Sauer , she will call back to schedule it .

## 2022-09-21 ENCOUNTER — Telehealth: Payer: Self-pay

## 2022-09-21 DIAGNOSIS — E785 Hyperlipidemia, unspecified: Secondary | ICD-10-CM

## 2022-09-21 DIAGNOSIS — Z79899 Other long term (current) drug therapy: Secondary | ICD-10-CM

## 2022-09-21 DIAGNOSIS — M810 Age-related osteoporosis without current pathological fracture: Secondary | ICD-10-CM

## 2022-09-21 NOTE — Telephone Encounter (Signed)
Caller name: BROOKLYNE RADKE  On DPR?: Yes  Call back number: [phone] (mobile)  Provider they see: Kathyrn Drown, MD  Reason for call:Pt has Phy coming up on Nov 17th and needs blood work ordered

## 2022-09-21 NOTE — Telephone Encounter (Signed)
Recommend lipid, liver, metabolic 7 Hyperlipidemia, high risk meds  If she would like to do a vitamin D level this can be added as well under diagnosis of osteoporosis

## 2022-09-21 NOTE — Telephone Encounter (Signed)
Left message for patient to return the call for additional details and recommendations.  Verify with patient weather she would like to have vit D level checked

## 2022-09-29 NOTE — Telephone Encounter (Signed)
Pt replied via my chart and states she would like Vit D also. Lab orders placed and pt is aware

## 2022-09-29 NOTE — Addendum Note (Signed)
Addended by: Vicente Males on: 09/29/2022 03:39 PM   Modules accepted: Orders

## 2022-09-29 NOTE — Telephone Encounter (Signed)
Mychart message sent to patient.

## 2022-10-06 LAB — HEPATIC FUNCTION PANEL
ALT: 20 IU/L (ref 0–32)
AST: 26 IU/L (ref 0–40)
Albumin: 4.6 g/dL (ref 3.9–4.9)
Alkaline Phosphatase: 67 IU/L (ref 44–121)
Bilirubin Total: 0.4 mg/dL (ref 0.0–1.2)
Bilirubin, Direct: 0.13 mg/dL (ref 0.00–0.40)
Total Protein: 6.9 g/dL (ref 6.0–8.5)

## 2022-10-06 LAB — BASIC METABOLIC PANEL
BUN/Creatinine Ratio: 22 (ref 12–28)
BUN: 20 mg/dL (ref 8–27)
CO2: 24 mmol/L (ref 20–29)
Calcium: 9.2 mg/dL (ref 8.7–10.3)
Chloride: 104 mmol/L (ref 96–106)
Creatinine, Ser: 0.89 mg/dL (ref 0.57–1.00)
Glucose: 89 mg/dL (ref 70–99)
Potassium: 4.1 mmol/L (ref 3.5–5.2)
Sodium: 143 mmol/L (ref 134–144)
eGFR: 71 mL/min/{1.73_m2} (ref >59)

## 2022-10-06 LAB — LIPID PANEL
Chol/HDL Ratio: 2.3 ratio (ref 0.0–4.4)
Cholesterol, Total: 195 mg/dL (ref 100–199)
HDL: 86 mg/dL (ref >39)
LDL Chol Calc (NIH): 95 mg/dL (ref 0–99)
Triglycerides: 79 mg/dL (ref 0–149)
VLDL Cholesterol Cal: 14 mg/dL (ref 5–40)

## 2022-10-06 LAB — VITAMIN D 25 HYDROXY (VIT D DEFICIENCY, FRACTURES): Vit D, 25-Hydroxy: 62.4 ng/mL (ref 30.0–100.0)

## 2022-10-14 ENCOUNTER — Encounter: Payer: Self-pay | Admitting: Nurse Practitioner

## 2022-10-14 ENCOUNTER — Ambulatory Visit (INDEPENDENT_AMBULATORY_CARE_PROVIDER_SITE_OTHER): Payer: Medicare HMO | Admitting: Nurse Practitioner

## 2022-10-14 VITALS — BP 118/70 | HR 75 | Temp 98.2°F | Ht 61.02 in | Wt 121.4 lb

## 2022-10-14 DIAGNOSIS — J309 Allergic rhinitis, unspecified: Secondary | ICD-10-CM

## 2022-10-14 DIAGNOSIS — Z0000 Encounter for general adult medical examination without abnormal findings: Secondary | ICD-10-CM

## 2022-10-14 DIAGNOSIS — Z01419 Encounter for gynecological examination (general) (routine) without abnormal findings: Secondary | ICD-10-CM

## 2022-10-14 DIAGNOSIS — H1013 Acute atopic conjunctivitis, bilateral: Secondary | ICD-10-CM | POA: Diagnosis not present

## 2022-10-14 NOTE — Progress Notes (Unsigned)
Subjective:    Patient ID: Susan Liu, female    DOB: 10-12-1956, 66 y.o.   MRN: [id number]  HPI  AWV- Annual Wellness Visit  The patient was seen for their annual wellness visit. The patient's past medical history, surgical history, and family history were reviewed. Pertinent vaccines were reviewed ( tetanus, pneumonia, shingles, flu) The patient's medication list was reviewed and updated.  The height and weight were entered.  BMI recorded in electronic record elsewhere  Cognitive screening was completed.    Falls /depression screening electronically recorded within record elsewhere  Current tobacco usage:no (All patients who use tobacco were given written and verbal information on quitting)  Recent listing of emergency department/hospitalizations over the past year were reviewed.  current specialist the patient sees on a regular basis: endocrinology Dr Aundra Dubin annual wellness visit patient questionnaire was reviewed.  A written screening schedule for the patient for the next 5-10 years was given. Appropriate discussion of followup regarding next visit was discussed.  Describes diet is "good".  Very active, swims 3 times weekly, walks 20 to 30 minutes daily and stretches/uses bands daily. Married, same sexual partner.  Postmenopausal.  No vaginal bleeding or pelvic pain. Regular vision and dental exams. Has osteoporosis per her DEXA scan.  Is currently off her alendronate.  Seeing endocrinologist.  Review of Systems  Constitutional:  Negative for activity change, appetite change, fatigue and fever.  HENT:  Negative for ear pain, postnasal drip, rhinorrhea, sinus pressure, sinus pain, sore throat and trouble swallowing.   Eyes:  Positive for itching.       Has had itchy eyes slightly with slight redness.  Clear tearing.  No noted problems during her last eye exam.  Respiratory:  Negative for cough, chest tightness, shortness of breath and wheezing.    Cardiovascular:  Negative for chest pain.  Gastrointestinal:  Negative for abdominal distention, abdominal pain, constipation, diarrhea, nausea and vomiting.  Genitourinary:  Negative for difficulty urinating, dysuria, enuresis, frequency, genital sores, pelvic pain, urgency, vaginal bleeding and vaginal discharge.       Denies any rashes irritation or lesions in the genital area.  Psychiatric/Behavioral:  Negative for self-injury, sleep disturbance and suicidal ideas.        Objective:   Physical Exam Vitals and nursing note reviewed.  Constitutional:      General: She is not in acute distress.    Appearance: She is well-developed.  HENT:     Ears:     Comments: TMs retracted bilaterally, no erythema.    Nose:     Comments: Nares clear.    Mouth/Throat:     Comments: Pharynx clear and moist, PND noted. Eyes:     Comments: Conjunctiva minimally injected bilaterally.  Clear tearing noted at times.  Neck:     Thyroid: No thyromegaly.     Trachea: No tracheal deviation.     Comments: Thyroid non tender to palpation. No mass or goiter noted.  Cardiovascular:     Rate and Rhythm: Normal rate and regular rhythm.     Heart sounds: Normal heart sounds. No murmur heard. Pulmonary:     Effort: Pulmonary effort is normal.     Breath sounds: Normal breath sounds.  Chest:  Breasts:    Right: No swelling, inverted nipple, mass, skin change or tenderness.     Left: No swelling, inverted nipple, mass, skin change or tenderness.  Abdominal:     General: There is no distension.  Palpations: Abdomen is soft.     Tenderness: There is no abdominal tenderness.  Genitourinary:    Comments: Defers GU exam, denies any problems. Musculoskeletal:     Cervical back: Normal range of motion and neck supple.  Lymphadenopathy:     Cervical: No cervical adenopathy.     Upper Body:     Right upper body: No supraclavicular, axillary or pectoral adenopathy.     Left upper body: No supraclavicular,  axillary or pectoral adenopathy.  Skin:    General: Skin is warm and dry.     Findings: No rash.  Neurological:     Mental Status: She is alert and oriented to person, place, and time.  Psychiatric:        Mood and Affect: Mood normal.        Behavior: Behavior normal.        Thought Content: Thought content normal.        Judgment: Judgment normal.    Today's Vitals   10/14/22 0940  BP: 118/70  Pulse: 75  Temp: 98.2 F (36.8 C)  TempSrc: Oral  SpO2: 99%  Weight: 121 lb 6.4 oz (55.1 kg)  Height: 5' 1.02" (1.55 m)   Body mass index is 22.92 kg/m.   Results for orders placed or performed in visit on 09/21/22  Lipid panel  Result Value Ref Range   Cholesterol, Total 195 100 - 199 mg/dL   Triglycerides 79 0 - 149 mg/dL   HDL 86 >39 mg/dL   VLDL Cholesterol Cal 14 5 - 40 mg/dL   LDL Chol Calc (NIH) 95 0 - 99 mg/dL   Chol/HDL Ratio 2.3 0.0 - 4.4 ratio  Hepatic function panel  Result Value Ref Range   Total Protein 6.9 6.0 - 8.5 g/dL   Albumin 4.6 3.9 - 4.9 g/dL   Bilirubin Total 0.4 0.0 - 1.2 mg/dL   Bilirubin, Direct 0.13 0.00 - 0.40 mg/dL   Alkaline Phosphatase 67 44 - 121 IU/L   AST 26 0 - 40 IU/L   ALT 20 0 - 32 IU/L  Basic metabolic panel  Result Value Ref Range   Glucose 89 70 - 99 mg/dL   BUN 20 8 - 27 mg/dL   Creatinine, Ser 0.89 0.57 - 1.00 mg/dL   eGFR 71 >59 mL/min/1.73   BUN/Creatinine Ratio 22 12 - 28   Sodium 143 134 - 144 mmol/L   Potassium 4.1 3.5 - 5.2 mmol/L   Chloride 104 96 - 106 mmol/L   CO2 24 20 - 29 mmol/L   Calcium 9.2 8.7 - 10.3 mg/dL  VITAMIN D 25 Hydroxy (Vit-D Deficiency, Fractures)  Result Value Ref Range   Vit D, 25-Hydroxy 62.4 30.0 - 100.0 ng/mL  Reviewed labs with patient during visit.      Assessment & Plan:   Problem List Items Addressed This Visit       Respiratory   Allergic conjunctivitis of both eyes and rhinitis   Other Visit Diagnoses     Encounter for annual wellness exam in Medicare patient    -  Primary    Well woman exam         Recommend: Zaditor eye drops (generic) Or Pataday Wash eyes with baby shampoo Call back if symptoms persist. Restart Nasacort as directed. Call back if the symptoms worsen or persist. Continue healthy lifestyle habits. Discussed RSV vaccine and COVID-vaccine.  Patient defers at this time.  Declines flu vaccine. Has had her pneumonia vaccine. Mammogram DEXA and colonoscopy  all up-to-date. Return in about 1 year (around 10/15/2023) for physical.

## 2022-10-14 NOTE — Patient Instructions (Addendum)
Lichen sclerosis Zaditor eye drops (generic) Or Pataday Wash eyes with baby shampoo

## 2022-10-15 ENCOUNTER — Encounter: Payer: Self-pay | Admitting: Nurse Practitioner

## 2022-10-15 DIAGNOSIS — J309 Allergic rhinitis, unspecified: Secondary | ICD-10-CM | POA: Insufficient documentation

## 2022-11-04 ENCOUNTER — Ambulatory Visit: Payer: Medicare HMO | Admitting: Nurse Practitioner

## 2022-11-09 ENCOUNTER — Other Ambulatory Visit: Payer: Self-pay | Admitting: Family Medicine

## 2022-11-11 ENCOUNTER — Ambulatory Visit: Payer: Medicare HMO | Admitting: Nurse Practitioner

## 2022-11-11 VITALS — BP 125/75 | Ht 61.0 in | Wt 123.6 lb

## 2022-11-11 DIAGNOSIS — H0100B Unspecified blepharitis left eye, upper and lower eyelids: Secondary | ICD-10-CM | POA: Diagnosis not present

## 2022-11-11 DIAGNOSIS — H04123 Dry eye syndrome of bilateral lacrimal glands: Secondary | ICD-10-CM

## 2022-11-11 DIAGNOSIS — H0100A Unspecified blepharitis right eye, upper and lower eyelids: Secondary | ICD-10-CM

## 2022-11-11 MED ORDER — CYCLOSPORINE 0.05 % OP EMUL: 1.0000 [drp] | Freq: Two times a day (BID) | OPHTHALMIC | 2 refills | Status: DC

## 2022-11-11 NOTE — Patient Instructions (Signed)
Blepharitis Blepharitis refers to inflammation of the eyelids. It is a common condition and can cause dryness or grittiness in the eyes. Other symptoms may include: Reddish, scaly skin around the scalp and eyebrows. Burning or itching of the eyelids. Eye discharge at night that causes the eyelashes to stick together in the morning. Eyelashes that fall out. Redness of the eyes. Sensitivity to light. Follow these instructions at home: Pay attention to any changes in how your eyes look or feel. Tell your health care provider about any changes. Follow these instructions to help with your condition. Keeping clean Wash your hands often with soap and water for at least 20 seconds. Clean your eyes and wash the edges of your eyelids with diluted baby shampoo or commercial eyelid wipes. Do this 2 or more times a day. Wash your face and eyebrows at least once a day. Use a clean towel each time you dry your eyelids. Do not use this towel to clean or dry other areas of your body. Do not share your towel with anyone. General instructions Avoid wearing makeup until you get better. Do not share makeup with anyone. Avoid rubbing your eyes. Use warm compresses on the eyes for 5-10 minutes. Do this 1 or 2 times a day, or as told by your health care provider. You can use warm water on a towel, but a microwaveable heating pad often stays warm longer. The pad should be very warm but not hot enough to burn the skin. If you were prescribed an antibiotic ointment or steroid drops, apply or use the medicine as told by your health care provider. Do not stop using the medicine even if you feel better. Keep all follow-up visits. This is important. Contact a health care provider if: Your eyelids feel hot. You have blisters or a rash on your eyelids. The inflammation gets worse or does not go away in 2-4 days. Get help right away if: You have pain or redness that gets worse or spreads to other parts of your face. Your  vision changes. You have pain when looking at lights or moving objects. You have a fever. Summary Blepharitis refers to inflammation of the eyelids. It can cause dryness and grittiness in the eyes. Pay attention to any changes in how your eyes look or feel. Tell your health care provider about any changes. Follow home care instructions as told by your health care provider. Wash your hands often with soap and water for at least 20 seconds. Avoid wearing makeup. Do not rub your eyes. If you were prescribed an antibiotic ointment or steroid drops, apply or use the medicine as told by your health care provider. Get help right away if you have a fever, vision changes, pain or redness that gets worse or spreads to other parts of your face, or pain when looking at lights or moving objects. This information is not intended to replace advice given to you by your health care provider. Make sure you discuss any questions you have with your health care provider. Document Revised: 12/16/2020 Document Reviewed: 12/16/2020 Elsevier Patient Education  [place].

## 2022-11-11 NOTE — Progress Notes (Signed)
   Subjective:    Patient ID: Susan Liu, female    DOB: [birth year], 66 y.o.   MRN: [id number]  HPI  Patient arrives with watery eyes for over a month. Thought it was related to fall allergies. Presents for complaints of watery eyes over the past 6 weeks.  At times it looks like she is crying.  Itching around the eyelids mainly in the morning in the evenings.  Has had slight crusting of the eyelids, no purulent drainage.  Had an optometry exam few months ago which had normal results.  Tried Zaditor eyedrops as recommended at her last visit with no relief.  Has been using steroid nasal spray and antihistamines for her allergies.  Was taking Zyrtec at 1 point, recently switched to Burt which seemed to help some of the tearing.  No photosensitivity blurred vision or visual changes.  Has also stopped wearing make-up especially mascara to see if this will help.  Has been washing her eyes with baby shampoo as previously recommended. No changes in her medications or use of OTC products that could contribute to dry eyes.       Objective:   Physical Exam NAD.  Alert, oriented.  Sclera mildly injected bilaterally along the periphery.  Pupils equal and reactive to light.  Tangential lighting no abnormalities noted.  No abnormalities noted along the eyelashes.  1 small preauricular lymph node noted on the left side.  Today's Vitals   11/11/22 1510  BP: 125/75  Weight: 123 lb 9.6 oz (56.1 kg)  Height: '5\' 1"'$  (1.549 m)   Body mass index is 23.35 kg/m.      Assessment & Plan:   Problem List Items Addressed This Visit       Other   Bilateral dry eyes   Blepharitis of upper and lower eyelids of both eyes - Primary   Given written and verbal information on blepharitis.  Continue using baby shampoo as far as washing her eyes. Feel that Zyrtec may have had a stronger anticholinergic effect and increase drying in her eyes. Meds ordered this encounter  Medications   cycloSPORINE (RESTASIS)  0.05 % ophthalmic emulsion    Sig: Place 1 drop into both eyes 2 (two) times daily.    Dispense:  0.4 mL    Refill:  2    Order Specific Question:   Supervising Provider    Answer:   Sallee Lange A [[id number]]   Continue Allegra and steroid nasal spray.  Will do a trial of Restasis to treat possible dry eyes.  Patient to call back in a couple weeks if no improvement, sooner if new or worsening symptoms develop.

## 2022-11-13 ENCOUNTER — Encounter: Payer: Self-pay | Admitting: Nurse Practitioner

## 2022-11-13 DIAGNOSIS — H0100A Unspecified blepharitis right eye, upper and lower eyelids: Secondary | ICD-10-CM | POA: Insufficient documentation

## 2022-11-13 DIAGNOSIS — H04123 Dry eye syndrome of bilateral lacrimal glands: Secondary | ICD-10-CM | POA: Insufficient documentation

## 2022-11-15 ENCOUNTER — Telehealth: Payer: Self-pay | Admitting: Family Medicine

## 2022-11-15 NOTE — Telephone Encounter (Signed)
Fax from [facility] stating that Cyclosporine 0.05% drops are not covered by patient plan. Preferred alternative is Restatsis, Xiidradro. Please advise. Thank you

## 2022-11-17 NOTE — Telephone Encounter (Signed)
I have mentioned that what was sent in was the generic?  So therefore if Restasis is covered send it in his name brand 1 drop twice daily utilizing both eyes with 4 refills if this is rejected we will try the other medicine

## 2022-11-18 MED ORDER — RESTASIS 0.05 % OP EMUL: 1.0000 [drp] | Freq: Two times a day (BID) | OPHTHALMIC | 2 refills | Status: DC

## 2022-11-18 NOTE — Addendum Note (Signed)
Addended by: Vicente Males on: 11/18/2022 09:30 AM   Modules accepted: Orders

## 2022-11-18 NOTE — Telephone Encounter (Signed)
Restatsis sent to [facility]

## 2023-01-26 ENCOUNTER — Encounter: Payer: Self-pay | Admitting: Radiology

## 2023-01-28 ENCOUNTER — Encounter: Payer: Self-pay | Admitting: Nurse Practitioner

## 2023-01-30 ENCOUNTER — Telehealth: Payer: Self-pay | Admitting: Family Medicine

## 2023-01-30 NOTE — Telephone Encounter (Signed)
Patient is requesting refill on Restasis 0.5 eye drops call into [facility] what they filled is not what patient ask for and is more expensive.[facility] [address]

## 2023-01-30 NOTE — Telephone Encounter (Signed)
Nurses (EMR has a tendency to prescribe generic.  In this situation patient's insurance obviously has a better deal with the name brand and they do with the generic.)  Please send in the name brand per patient request because it cost her less thank you May have 1 year prescription

## 2023-01-31 NOTE — Telephone Encounter (Signed)
Please try to talk directly with the pharmacist This issue has been a "ping pong" issue for several messages both phone calls as well as MyChart messages  Apparently Restasis is covered 1 way as less expensive Another way-probably the generic-it is more expensive  Please try to talk with pharmacist directly find out what needs to be done, please do it, please send a message to the patient that it has been done, then hopefully this will be cleared up and out of our hands Thank you

## 2023-01-31 NOTE — Telephone Encounter (Signed)
Patient has been filling name brand with cheaper co pay. Pharmacy stated they will get brand name script ready for patient.  Patient notified via my chart

## 2023-02-06 ENCOUNTER — Other Ambulatory Visit: Payer: Self-pay

## 2023-02-07 NOTE — Telephone Encounter (Signed)
Nurses-not sure if additional prescription is necessary.  But this issue has become a sticky issue because patient was unhappy with previous refill attempts.  Please read previous messages from the patient.  This is been a reoccurring issue regarding what is the cheapest form of her medicine.  Generic versus name brand.  Somehow the patient was getting the medicine at a fairly low cost.  She stated that recently when the new version was sent in the cost was significantly higher.  The best I can tell is that patient was requesting  as name brand.  This will require good investigation to figure out what is the best approach on all of this.  I will not be able to do this.  Please talk with pharmacy find out what is the cheapest version.  Then my request is that you also directly talk with the patient.  She needs to know what you are finding out what is going on so that we do not keep getting messages back-and-forth.I do not know the solution to this but I am unwilling to send in another prescription without this being figured out.  Please figure it out-talk with pharmacist--also talk with the patient-then once this issue is solved to the best of every bodies abilities and satisfaction you have my permission to do the refills.  If it has already been handled to the satisfaction of everybody you have my permission to delete the pending refill.  I appreciate it! SAL

## 2023-03-07 ENCOUNTER — Encounter: Payer: Self-pay | Admitting: Family Medicine

## 2023-03-07 NOTE — Telephone Encounter (Signed)
Nurses I was aware of the visit with Dr.Kerr This is a good choice of medicine to help reduce the risk of fractures  Please go to reconcile medicines to put in Fosamax under the prescription of Dr.Ker  Thanks-Dr. Lilyan Punt

## 2023-03-09 ENCOUNTER — Other Ambulatory Visit: Payer: Self-pay

## 2023-03-22 ENCOUNTER — Ambulatory Visit (HOSPITAL_COMMUNITY): Payer: Medicare HMO | Admitting: Physical Therapy

## 2023-05-29 ENCOUNTER — Other Ambulatory Visit: Payer: Self-pay

## 2023-05-29 MED ORDER — ROSUVASTATIN CALCIUM 20 MG PO TABS: ORAL_TABLET | ORAL | 1 refills | Status: DC

## 2023-05-29 MED ORDER — RESTASIS 0.05 % OP EMUL: 1.0000 [drp] | Freq: Two times a day (BID) | OPHTHALMIC | 5 refills | Status: DC

## 2023-05-29 NOTE — Telephone Encounter (Signed)
Prescription Request  05/29/2023  LOV: Visit date not found  What is the name of the medication or equipment? rosuvastatin (CRESTOR) 20 MG tablet ,RESTASIS 0.05 % ophthalmic emulsion   Have you contacted your pharmacy to request a refill? Yes   Which pharmacy would you like this sent to? [facility]   Patient notified that their request is being sent to the clinical staff for review and that they should receive a response within 2 business days.   Please advise at Mobile [phone] (mobile)

## 2023-08-17 ENCOUNTER — Other Ambulatory Visit (HOSPITAL_COMMUNITY): Payer: Self-pay | Admitting: Family Medicine

## 2023-08-17 DIAGNOSIS — Z1231 Encounter for screening mammogram for malignant neoplasm of breast: Secondary | ICD-10-CM

## 2023-08-21 ENCOUNTER — Ambulatory Visit (HOSPITAL_COMMUNITY)
Admission: RE | Admit: 2023-08-21 | Discharge: 2023-08-21 | Disposition: A | Discharge status: Home / Self Care | Payer: Medicare HMO | Source: Ambulatory Visit | Attending: Family Medicine | Admitting: Family Medicine

## 2023-08-21 DIAGNOSIS — Z1231 Encounter for screening mammogram for malignant neoplasm of breast: Secondary | ICD-10-CM | POA: Insufficient documentation

## 2023-08-28 ENCOUNTER — Telehealth: Payer: Self-pay | Admitting: Family Medicine

## 2023-08-28 DIAGNOSIS — Z01419 Encounter for gynecological examination (general) (routine) without abnormal findings: Secondary | ICD-10-CM

## 2023-08-28 DIAGNOSIS — H04123 Dry eye syndrome of bilateral lacrimal glands: Secondary | ICD-10-CM

## 2023-08-28 DIAGNOSIS — Z79899 Other long term (current) drug therapy: Secondary | ICD-10-CM

## 2023-08-28 DIAGNOSIS — H0100A Unspecified blepharitis right eye, upper and lower eyelids: Secondary | ICD-10-CM

## 2023-08-28 DIAGNOSIS — E785 Hyperlipidemia, unspecified: Secondary | ICD-10-CM

## 2023-08-28 DIAGNOSIS — E559 Vitamin D deficiency, unspecified: Secondary | ICD-10-CM

## 2023-08-28 NOTE — Telephone Encounter (Signed)
Patient has physical in November and needing labs done

## 2023-08-28 NOTE — Telephone Encounter (Signed)
Lipid, liver, metabolic 7, CBC, vitamin D Hyperlipidemia, high risk medication, wellness, vitamin D deficiency

## 2023-08-28 NOTE — Telephone Encounter (Signed)
Refill on RESTASIS 0.05 % ophthalmic emulsion  [facility] [address]

## 2023-08-28 NOTE — Telephone Encounter (Signed)
RF times 12 (please be aware that if this medication requires approval by a eye specialist we may have to send her back through her eye doctor to get this medicine depends on her insurance company thank you)

## 2023-08-29 NOTE — Telephone Encounter (Signed)
See My chart message

## 2023-09-04 ENCOUNTER — Other Ambulatory Visit: Payer: Self-pay

## 2023-09-04 DIAGNOSIS — H04123 Dry eye syndrome of bilateral lacrimal glands: Secondary | ICD-10-CM

## 2023-09-04 DIAGNOSIS — H0100A Unspecified blepharitis right eye, upper and lower eyelids: Secondary | ICD-10-CM

## 2023-09-04 MED ORDER — RESTASIS 0.05 % OP EMUL: 1.0000 [drp] | Freq: Two times a day (BID) | OPHTHALMIC | 11 refills | Status: DC

## 2023-09-04 NOTE — Telephone Encounter (Signed)
Patient has been informed per drs recommendations 

## 2023-09-23 LAB — CBC WITH DIFFERENTIAL/PLATELET
Basophils Absolute: 0.1 10*3/uL (ref 0.0–0.2)
Basos: 1 %
EOS (ABSOLUTE): 0.2 10*3/uL (ref 0.0–0.4)
Eos: 2 %
Hematocrit: 40.8 % (ref 34.0–46.6)
Hemoglobin: 12.8 g/dL (ref 11.1–15.9)
Immature Grans (Abs): 0 10*3/uL (ref 0.0–0.1)
Immature Granulocytes: 0 %
Lymphocytes Absolute: 2.5 10*3/uL (ref 0.7–3.1)
Lymphs: 37 %
MCH: 29.1 pg (ref 26.6–33.0)
MCHC: 31.4 g/dL — ABNORMAL LOW (ref 31.5–35.7)
MCV: 93 fL (ref 79–97)
Monocytes Absolute: 0.6 10*3/uL (ref 0.1–0.9)
Monocytes: 10 %
Neutrophils Absolute: 3.3 10*3/uL (ref 1.4–7.0)
Neutrophils: 50 %
Platelets: 382 10*3/uL (ref 150–450)
RBC: 4.4 x10E6/uL (ref 3.77–5.28)
RDW: 12.8 % (ref 11.7–15.4)
WBC: 6.6 10*3/uL (ref 3.4–10.8)

## 2023-09-23 LAB — BASIC METABOLIC PANEL (7)
BUN/Creatinine Ratio: 14 (ref 12–28)
BUN: 11 mg/dL (ref 8–27)
CO2: 26 mmol/L (ref 20–29)
Chloride: 104 mmol/L (ref 96–106)
Creatinine, Ser: 0.8 mg/dL (ref 0.57–1.00)
Glucose: 88 mg/dL (ref 70–99)
Potassium: 4.1 mmol/L (ref 3.5–5.2)
Sodium: 143 mmol/L (ref 134–144)
eGFR: 81 mL/min/{1.73_m2} (ref >59)

## 2023-09-23 LAB — HEPATIC FUNCTION PANEL
ALT: 18 [IU]/L (ref 0–32)
AST: 22 [IU]/L (ref 0–40)
Albumin: 4.4 g/dL (ref 3.9–4.9)
Alkaline Phosphatase: 60 [IU]/L (ref 44–121)
Bilirubin Total: 0.5 mg/dL (ref 0.0–1.2)
Bilirubin, Direct: 0.16 mg/dL (ref 0.00–0.40)
Total Protein: 6.7 g/dL (ref 6.0–8.5)

## 2023-09-23 LAB — LIPID PANEL
Chol/HDL Ratio: 2.1 {ratio} (ref 0.0–4.4)
Cholesterol, Total: 188 mg/dL (ref 100–199)
HDL: 88 mg/dL (ref >39)
LDL Chol Calc (NIH): 87 mg/dL (ref 0–99)
Triglycerides: 70 mg/dL (ref 0–149)
VLDL Cholesterol Cal: 13 mg/dL (ref 5–40)

## 2023-09-23 LAB — VITAMIN D 25 HYDROXY (VIT D DEFICIENCY, FRACTURES): Vit D, 25-Hydroxy: 62.7 ng/mL (ref 30.0–100.0)

## 2023-10-04 ENCOUNTER — Encounter: Payer: Medicare HMO | Admitting: Nurse Practitioner

## 2023-11-10 ENCOUNTER — Encounter: Payer: Medicare HMO | Admitting: Nurse Practitioner

## 2023-11-24 ENCOUNTER — Ambulatory Visit (INDEPENDENT_AMBULATORY_CARE_PROVIDER_SITE_OTHER): Payer: Medicare HMO | Admitting: Nurse Practitioner

## 2023-11-24 VITALS — BP 133/76 | Ht 61.0 in | Wt 124.6 lb

## 2023-11-24 DIAGNOSIS — Z0000 Encounter for general adult medical examination without abnormal findings: Secondary | ICD-10-CM | POA: Diagnosis not present

## 2023-11-24 DIAGNOSIS — Z01419 Encounter for gynecological examination (general) (routine) without abnormal findings: Secondary | ICD-10-CM

## 2023-11-24 MED ORDER — TRIAMCINOLONE ACETONIDE 55 MCG/ACT NA AERO: 2.0000 | INHALATION_SPRAY | Freq: Every day | NASAL | 3 refills | Status: AC

## 2023-11-24 MED ORDER — SUMATRIPTAN SUCCINATE 100 MG PO TABS: 100.0000 mg | ORAL_TABLET | Freq: Every day | ORAL | 5 refills | Status: AC | PRN

## 2023-11-24 MED ORDER — ROSUVASTATIN CALCIUM 20 MG PO TABS: ORAL_TABLET | ORAL | 1 refills | Status: DC

## 2023-11-24 NOTE — Patient Instructions (Addendum)
Reclast  Prolia Tdap or Tetanus vaccine

## 2023-11-24 NOTE — Progress Notes (Unsigned)
Annual Wellness Visit     Patient: Susan Liu, Female    DOB: 12-24-1955, 67 y.o.   MRN: [id number]  Subjective  Chief Complaint  Patient presents with   Medicare Wellness    Susan Liu is a 67 y.o. female who presents today for her Annual Wellness Visit.  HPI  Outpatient Medications Prior to Visit  Medication Sig Dispense Refill   Ascorbic Acid (VITAMIN C) POWD Take 0.5 Scoops by mouth 4 (four) times a week.     Calcium Carb-Cholecalciferol (CALTRATE 600+D3 PO) Take 1 tablet by mouth daily.     Creatine Monohydrate POWD Take 0.5 Scoops by mouth 4 (four) times a week.     Glucosamine-Chondroit-Vit C-Mn (GLUCOSAMINE 1500 COMPLEX PO) Take 1,500 mg by mouth daily.     Lysine HCl POWD Take 1,000 mg by mouth 4 (four) times a week.     Menthol (ICY HOT) 5 % PTCH Apply 1 patch topically daily as needed (pain).     Methylsulfonylmethane (MSM PO) Take 1 Dose by mouth 4 (four) times a week. 1 dose = 1 teaspoonful     Multiple Vitamins-Minerals (ONE-A-DAY WOMENS PO) Take 1 tablet by mouth daily.     Omega-3 Fatty Acids (FISH OIL) 1200 MG CAPS Take 1,200 mg by mouth daily.     phenylephrine (SUDAFED PE) 10 MG TABS tablet Take 10 mg by mouth every 4 (four) hours as needed (sinus headaches).     RESTASIS 0.05 % ophthalmic emulsion Place 1 drop into both eyes 2 (two) times daily. 0.4 mL 11   vitamin C (ASCORBIC ACID) 500 MG tablet Take 1,000 mg by mouth 3 (three) times a week.     alendronate (FOSAMAX) 70 MG tablet 1 tablet 30 minutes before the first food, beverage or medicine of the day with plain water Orally Once a week for 90 days     doxylamine, Sleep, (UNISOM) 25 MG tablet Take 12.5 mg by mouth at bedtime as needed for sleep.     ibuprofen (ADVIL) 200 MG tablet Take 400 mg by mouth every 6 (six) hours as needed for moderate pain or headache.     rosuvastatin (CRESTOR) 20 MG tablet TAKE ONE TABLET (20MG  TOTAL) BY MOUTH DAILY STOP PRAVASTATIN 90 tablet 1   SUMAtriptan (IMITREX)  100 MG tablet TAKE ONE TABLET BY MOUTH EVERY DAY AS NEEDED 9 tablet 5   triamcinolone (NASACORT) 55 MCG/ACT AERO nasal inhaler Place 2 sprays into the nose daily.     No facility-administered medications prior to visit.      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   Spasm of muscle 07/29/2014   Stiffness of joints, not elsewhere classified, multiple sites 07/29/2014   Osteopenia 06/05/2014   Cervical nerve root compression 06/05/2014   Osteoporosis 05/24/2013   Hyperlipemia 05/24/2013   Migraines 05/03/2013   Past Medical History:  Diagnosis Date   Arthritis    Heart murmur 1993   Hyperlipidemia    Migraine    Osteoporosis    Past Surgical History:  Procedure Laterality Date   CESAREAN SECTION  x2   COLONOSCOPY  2008   Dr.Rourk-normal exam   COLONOSCOPY WITH PROPOFOL N/A 11/10/2021   Procedure: COLONOSCOPY WITH PROPOFOL;  Surgeon: Dolores Frame, MD;  Location: [facility];  Service: Gastroenterology;  Laterality: N/A;  8:30 / Rm 3  phone call   POLYPECTOMY  11/10/2021   Procedure: POLYPECTOMY;  Surgeon: Marguerita Merles, Reuel Boom, MD;  Location: [facility];  Service: Gastroenterology;;   Social History   Tobacco Use   Smoking status: Never   Smokeless tobacco: Never  Vaping Use   Vaping status: Never Used  Substance Use Topics   Alcohol use: Not Currently   Drug use: Not Currently      Functional Status Survey: Is the patient deaf or have difficulty hearing?: No Does the patient have difficulty seeing, even when wearing  glasses/contacts?: No Does the patient have difficulty concentrating, remembering, or making decisions?: No Does the patient have difficulty walking or climbing stairs?: No Does the patient have difficulty dressing or bathing?: No Does the patient have difficulty doing errands alone such as visiting a doctor's office or shopping?: No  Patient has up to date ACP and will.     Allergies  Allergen Reactions   Erythromycin Itching    Patient Care Team: Babs Sciara, MD as PCP - General (Family Medicine)  Review of Systems  Constitutional:  Negative for malaise/fatigue and weight loss.  HENT:  Negative for ear pain, hearing loss and sore throat.   Respiratory:  Negative for cough and shortness of breath.   Cardiovascular:  Negative for chest pain and leg swelling.  Gastrointestinal:  Negative for abdominal pain, blood in stool, constipation, diarrhea, nausea and vomiting.  Genitourinary:  Negative for dysuria, frequency and urgency.  Musculoskeletal:        Has osteoporosis. Being followed by specialist.   Psychiatric/Behavioral:  Negative for depression and suicidal ideas. The patient is not nervous/anxious and does not have insomnia.   Same female sexual partner.      Objective  BP 133/76   Ht 5\' 1"  (1.549 m)   Wt 124 lb 9.6 oz (56.5 kg)   BMI 23.54 kg/m    Physical Exam Vitals and nursing note reviewed. Chaperone present: defers chaperone.  Constitutional:      General: She is not in acute distress.    Appearance: She is well-developed.  Neck:     Thyroid: No thyromegaly.     Trachea: No tracheal deviation.     Comments: Thyroid non tender to palpation. No mass or goiter noted.  Cardiovascular:     Rate and Rhythm: Normal rate and regular rhythm.     Heart sounds: Normal heart sounds. No murmur heard. Pulmonary:     Effort: Pulmonary effort is normal.     Breath sounds: Normal breath sounds.  Chest:  Breasts:    Right: No swelling, inverted nipple, mass, skin  change or tenderness.     Left: No swelling, inverted nipple, mass, skin change or tenderness.  Abdominal:     General: There is no distension.     Palpations: Abdomen is soft.     Tenderness: There is no abdominal tenderness.  Genitourinary:    Comments: Defers GU exam. Denies any problems. Previous PAP smears were normal.  Musculoskeletal:     Cervical back: Normal range of motion and neck supple.  Lymphadenopathy:     Cervical: No cervical adenopathy.     Upper Body:     Right upper body: No supraclavicular, axillary  or pectoral adenopathy.     Left upper body: No supraclavicular, axillary or pectoral adenopathy.  Skin:    General: Skin is warm and dry.     Findings: No rash.  Neurological:     Mental Status: She is alert and oriented to person, place, and time.  Psychiatric:        Mood and Affect: Mood normal.        Behavior: Behavior normal.        Thought Content: Thought content normal.        Judgment: Judgment normal.    Today's Vitals   11/24/23 0941  BP: 133/76  Weight: 124 lb 9.6 oz (56.5 kg)  Height: 5\' 1"  (1.549 m)   Body mass index is 23.54 kg/m.       Most recent fall risk assessment:    11/24/2023    9:43 AM  Fall Risk   Falls in the past year? 0  Risk for fall due to : No Fall Risks  Follow up Falls evaluation completed    Most recent depression screenings:    11/24/2023    9:43 AM 11/11/2022    3:11 PM  PHQ 2/9 Scores  PHQ - 2 Score 0 0       Assessment & Plan   Annual wellness visit done today including the all of the following: Reviewed patient's Family Medical History Reviewed and updated list of patient's medical providers Assessed patient's functional ability Established a written schedule for health screening services Health Risk Assessent Completed and Reviewed     Immunization History  Administered Date(s) Administered   DT (Pediatric) 06/13/2013   Fluad Quad(high Dose 65+) 09/03/2021   Influenza-Unspecified  08/29/2011, 08/28/2014, 10/11/2016, 08/21/2020   Moderna Sars-Covid-2 Vaccination 03/05/2020, 04/07/2020   PNEUMOCOCCAL CONJUGATE-20 06/16/2021   Zoster Recombinant(Shingrix) 11/10/2020, 02/02/2021   Zoster, Live 07/15/2014    Health Maintenance  Topic Date Due   DTaP/Tdap/Td (2 - Tdap) 06/14/2023   COVID-19 Vaccine (3 - Moderna risk series) 12/10/2023 (Originally 05/05/2020)   INFLUENZA VACCINE  02/26/2024 (Originally 06/29/2023)   DEXA SCAN  08/25/2024   Medicare Annual Wellness (AWV)  11/23/2024   MAMMOGRAM  08/20/2025   Colonoscopy  11/10/2026   Pneumonia Vaccine 50+ Years old  Completed   Hepatitis C Screening  Completed   Zoster Vaccines- Shingrix  Completed   HPV VACCINES  Aged Out   COLON CANCER SCREENING ANNUAL FOBT  Discontinued     Discussed health benefits of physical activity, and encouraged her to engage in regular exercise appropriate for her age and condition.    Problem List Items Addressed This Visit   None Visit Diagnoses       Encounter for annual wellness exam in Medicare patient    -  Primary     Well woman exam          Meds ordered this encounter  Medications   rosuvastatin (CRESTOR) 20 MG tablet    Sig: TAKE ONE TABLET (20MG  TOTAL) BY MOUTH DAILY STOP PRAVASTATIN    Dispense:  90 tablet    Refill:  1    Supervising Provider:   Lilyan Punt A [[id number]]   SUMAtriptan (IMITREX) 100 MG tablet    Sig: Take 1 tablet (100 mg total) by mouth daily as needed. May repeat in 2 hours if headache persists or recurs.    Dispense:  9 tablet    Refill:  5    Supervising Provider:   Lilyan Punt A [phone]  triamcinolone (NASACORT) 55 MCG/ACT AERO nasal inhaler    Sig: Place 2 sprays into the nose daily.    Dispense:  3 each    Refill:  3    Supervising Provider:   Lilyan Punt A [[id number]]   Refills on regular medications. Encouraged continued healthy lifestyle habits.  Colonoscopy, mammogram, bone density, vision and dental exams are up-to-date.  Defers flu  vaccine.  Return in about 1 year (around 11/23/2024) for physical.     Campbell Riches, NP

## 2023-11-27 ENCOUNTER — Encounter: Payer: Self-pay | Admitting: Nurse Practitioner

## 2023-11-30 ENCOUNTER — Telehealth: Payer: Self-pay

## 2023-11-30 NOTE — Telephone Encounter (Signed)
 Copied from CRM [id number]. Topic: Clinical - Lab/Test Results >> Nov 30, 2023  9:30 AM Susan Liu wrote: Reason for CRM: Patient noticed an abnormal check on her [facility] lab work results and wants to know if she needs to know anything about this result.  Requesting call back [phone] or message on mychart.

## 2024-03-07 ENCOUNTER — Other Ambulatory Visit: Payer: Self-pay

## 2024-03-07 ENCOUNTER — Telehealth: Payer: Self-pay | Admitting: Pharmacy Technician

## 2024-03-07 DIAGNOSIS — M810 Age-related osteoporosis without current pathological fracture: Secondary | ICD-10-CM | POA: Insufficient documentation

## 2024-03-07 NOTE — Telephone Encounter (Signed)
 Auth Submission: NO AUTH NEEDED Site of care: Site of care: [facility] Payer: [employer] Medication & CPT/J Code(s) submitted: Reclast (Zolendronic acid) [id number] Route of submission (phone, fax, portal):  Phone # Fax # Auth type: Buy/Bill PB Units/visits requested: X1 Reference number:  Approval from: 03/07/24 to 11/27/24

## 2024-03-11 ENCOUNTER — Other Ambulatory Visit: Payer: Self-pay

## 2024-08-01 ENCOUNTER — Encounter: Payer: Self-pay | Admitting: Family Medicine

## 2024-08-01 ENCOUNTER — Ambulatory Visit (INDEPENDENT_AMBULATORY_CARE_PROVIDER_SITE_OTHER): Admitting: Family Medicine

## 2024-08-01 VITALS — BP 128/79 | HR 64 | Temp 97.2°F | Ht 61.0 in | Wt 123.0 lb

## 2024-08-01 DIAGNOSIS — L239 Allergic contact dermatitis, unspecified cause: Secondary | ICD-10-CM

## 2024-08-01 MED ORDER — MOXIFLOXACIN HCL 0.5 % OP SOLN: OPHTHALMIC | 0 refills | Status: DC

## 2024-08-01 MED ORDER — HYDROCORTISONE 1 % EX CREA: 1.0000 | TOPICAL_CREAM | Freq: Two times a day (BID) | CUTANEOUS | 0 refills | Status: DC

## 2024-08-01 NOTE — Progress Notes (Signed)
   Subjective:    Patient ID: Susan Liu, female    DOB: 06-01-1956, 68 y.o.   MRN: [id number]  HPI  Having redness and itchiness only in one eye that comes and goes for a week at a time or more.  Patient has excoriated area along the upper eyelid along with redness she feels some thickening in the skin in addition to this a little bit of tearing but not severe Earlier this morning she did have crusting where it is crusted together No copious discharge She denies any other particular troubles Review of Systems     Objective:   Physical Exam  Redness in the left eye lid region on upper and lower more toward the inner aspect sclera.  Normal conjunctivo-.  Normal pupil cornea.  Normal no enlarged lymph nodes in the facial region or neck      Assessment & Plan:   Contact dermatitis-low strength hydrocortisone  apply twice daily for the next 7 to 10 days May use allergy eyedrops Also antibiotic eyedrop If progressive troubles or worse to notify us

## 2024-08-02 ENCOUNTER — Other Ambulatory Visit: Payer: Self-pay | Admitting: Nurse Practitioner

## 2024-08-22 ENCOUNTER — Other Ambulatory Visit: Payer: Self-pay | Admitting: Family Medicine

## 2024-08-26 ENCOUNTER — Encounter: Payer: Self-pay | Admitting: Family Medicine

## 2024-09-20 ENCOUNTER — Other Ambulatory Visit (HOSPITAL_COMMUNITY): Payer: Self-pay | Admitting: Family Medicine

## 2024-09-20 DIAGNOSIS — Z1231 Encounter for screening mammogram for malignant neoplasm of breast: Secondary | ICD-10-CM

## 2024-09-26 ENCOUNTER — Other Ambulatory Visit: Payer: Self-pay | Admitting: Family Medicine

## 2024-09-26 DIAGNOSIS — H0100A Unspecified blepharitis right eye, upper and lower eyelids: Secondary | ICD-10-CM

## 2024-09-26 DIAGNOSIS — H04123 Dry eye syndrome of bilateral lacrimal glands: Secondary | ICD-10-CM

## 2024-10-02 ENCOUNTER — Ambulatory Visit (HOSPITAL_COMMUNITY)
Admission: RE | Admit: 2024-10-02 | Discharge: 2024-10-02 | Disposition: A | Discharge status: Home / Self Care | Source: Ambulatory Visit | Attending: Family Medicine | Admitting: Family Medicine

## 2024-10-02 ENCOUNTER — Telehealth: Payer: Self-pay | Admitting: *Deleted

## 2024-10-02 DIAGNOSIS — Z1231 Encounter for screening mammogram for malignant neoplasm of breast: Secondary | ICD-10-CM | POA: Insufficient documentation

## 2024-10-02 DIAGNOSIS — M8580 Other specified disorders of bone density and structure, unspecified site: Secondary | ICD-10-CM

## 2024-10-02 NOTE — Telephone Encounter (Signed)
 Per Dr Glendia: Patient due for Bone Density Test

## 2024-10-04 ENCOUNTER — Other Ambulatory Visit (HOSPITAL_COMMUNITY): Payer: Self-pay | Admitting: Family Medicine

## 2024-10-04 DIAGNOSIS — R928 Other abnormal and inconclusive findings on diagnostic imaging of breast: Secondary | ICD-10-CM

## 2024-10-08 NOTE — Telephone Encounter (Signed)
 Messaged patient about getting a bone density completed.

## 2024-10-09 ENCOUNTER — Other Ambulatory Visit: Payer: Self-pay

## 2024-10-09 NOTE — Telephone Encounter (Signed)
 That is fine thank you .

## 2024-10-09 NOTE — Telephone Encounter (Signed)
 Patient is going to contact her ortho doctor and get his say on getting her bone density test now or wait a few months before getting it done because of a medication she is taking

## 2024-10-09 NOTE — Telephone Encounter (Signed)
 Nurses-go ahead and order the bone density due to age and osteopenia

## 2024-10-10 ENCOUNTER — Telehealth: Payer: Self-pay

## 2024-10-10 ENCOUNTER — Ambulatory Visit (HOSPITAL_COMMUNITY)
Admission: RE | Admit: 2024-10-10 | Discharge: 2024-10-10 | Disposition: A | Discharge status: Home / Self Care | Source: Ambulatory Visit | Attending: Family Medicine | Admitting: Family Medicine

## 2024-10-10 ENCOUNTER — Other Ambulatory Visit: Payer: Self-pay

## 2024-10-10 ENCOUNTER — Ambulatory Visit: Payer: Self-pay | Admitting: Family Medicine

## 2024-10-10 ENCOUNTER — Ambulatory Visit (HOSPITAL_COMMUNITY)
Admission: RE | Admit: 2024-10-10 | Discharge: 2024-10-10 | Disposition: A | Source: Ambulatory Visit | Attending: Family Medicine | Admitting: Family Medicine

## 2024-10-10 ENCOUNTER — Encounter (HOSPITAL_COMMUNITY): Payer: Self-pay

## 2024-10-10 ENCOUNTER — Other Ambulatory Visit (HOSPITAL_COMMUNITY): Payer: Self-pay | Admitting: Family Medicine

## 2024-10-10 DIAGNOSIS — R928 Other abnormal and inconclusive findings on diagnostic imaging of breast: Secondary | ICD-10-CM | POA: Diagnosis present

## 2024-10-10 DIAGNOSIS — M810 Age-related osteoporosis without current pathological fracture: Secondary | ICD-10-CM | POA: Insufficient documentation

## 2024-10-10 DIAGNOSIS — M8580 Other specified disorders of bone density and structure, unspecified site: Secondary | ICD-10-CM | POA: Diagnosis present

## 2024-10-10 NOTE — Telephone Encounter (Signed)
 Copied from CRM [id number]. Topic: Appointments - Appointment Info/Confirmation >> Oct 10, 2024  7:55 AM Donna BRAVO wrote: Patient/patient representative is calling for information regarding an appointment.  Patient calling to let Nathanel know, Aniya is confirming the appt on 10/14/24 she will attend the Dexa scan appt

## 2024-10-14 ENCOUNTER — Other Ambulatory Visit (HOSPITAL_COMMUNITY)

## 2024-10-22 ENCOUNTER — Ambulatory Visit (HOSPITAL_COMMUNITY)

## 2024-10-22 ENCOUNTER — Inpatient Hospital Stay (HOSPITAL_COMMUNITY): Admission: RE | Admit: 2024-10-22 | Source: Ambulatory Visit

## 2024-10-27 ENCOUNTER — Encounter: Payer: Self-pay | Admitting: Family Medicine

## 2024-10-30 ENCOUNTER — Other Ambulatory Visit: Payer: Self-pay | Admitting: Nurse Practitioner

## 2024-10-30 DIAGNOSIS — E785 Hyperlipidemia, unspecified: Secondary | ICD-10-CM

## 2024-10-30 DIAGNOSIS — Z79899 Other long term (current) drug therapy: Secondary | ICD-10-CM

## 2024-11-19 LAB — CBC WITH DIFFERENTIAL/PLATELET
Basophils Absolute: 0.1 x10E3/uL (ref 0.0–0.2)
Basos: 1 %
EOS (ABSOLUTE): 0.2 x10E3/uL (ref 0.0–0.4)
Eos: 2 %
Hematocrit: 42.8 % (ref 34.0–46.6)
Hemoglobin: 13.3 g/dL (ref 11.1–15.9)
Immature Grans (Abs): 0 x10E3/uL (ref 0.0–0.1)
Immature Granulocytes: 0 %
Lymphocytes Absolute: 2.7 x10E3/uL (ref 0.7–3.1)
Lymphs: 41 %
MCH: 29.2 pg (ref 26.6–33.0)
MCHC: 31.1 g/dL — ABNORMAL LOW (ref 31.5–35.7)
MCV: 94 fL (ref 79–97)
Monocytes Absolute: 0.6 x10E3/uL (ref 0.1–0.9)
Monocytes: 9 %
Neutrophils Absolute: 3.1 x10E3/uL (ref 1.4–7.0)
Neutrophils: 47 %
Platelets: 433 x10E3/uL (ref 150–450)
RBC: 4.56 x10E6/uL (ref 3.77–5.28)
RDW: 12.6 % (ref 11.7–15.4)
WBC: 6.6 x10E3/uL (ref 3.4–10.8)

## 2024-11-19 LAB — COMPREHENSIVE METABOLIC PANEL WITH GFR
ALT: 20 IU/L (ref 0–32)
AST: 26 IU/L (ref 0–40)
Albumin: 4.4 g/dL (ref 3.9–4.9)
Alkaline Phosphatase: 60 IU/L (ref 49–135)
BUN/Creatinine Ratio: 20 (ref 12–28)
BUN: 16 mg/dL (ref 8–27)
Bilirubin Total: 0.6 mg/dL (ref 0.0–1.2)
CO2: 25 mmol/L (ref 20–29)
Calcium: 9.4 mg/dL (ref 8.7–10.3)
Chloride: 100 mmol/L (ref 96–106)
Creatinine, Ser: 0.81 mg/dL (ref 0.57–1.00)
Globulin, Total: 2.2 g/dL (ref 1.5–4.5)
Glucose: 87 mg/dL (ref 70–99)
Potassium: 4.4 mmol/L (ref 3.5–5.2)
Sodium: 140 mmol/L (ref 134–144)
Total Protein: 6.6 g/dL (ref 6.0–8.5)
eGFR: 79 mL/min/1.73

## 2024-11-19 LAB — LIPID PANEL
Chol/HDL Ratio: 2.3 ratio (ref 0.0–4.4)
Cholesterol, Total: 200 mg/dL — ABNORMAL HIGH (ref 100–199)
HDL: 87 mg/dL
LDL Chol Calc (NIH): 100 mg/dL — ABNORMAL HIGH (ref 0–99)
Triglycerides: 72 mg/dL (ref 0–149)
VLDL Cholesterol Cal: 13 mg/dL (ref 5–40)

## 2024-11-22 ENCOUNTER — Encounter: Payer: Self-pay | Admitting: Nurse Practitioner

## 2024-11-22 ENCOUNTER — Telehealth: Payer: Self-pay | Admitting: Family Medicine

## 2024-11-22 NOTE — Telephone Encounter (Signed)
 Copied from CRM [id number]. Topic: Appointments - Scheduling Inquiry for Clinic >> Nov 22, 2024  9:15 AM Susan Liu wrote: Reason for CRM: Pt just rescheduled her upcoming AWV to in office, please advise if the provider will be available.   Best contact: [phone]

## 2024-11-29 ENCOUNTER — Ambulatory Visit: Payer: Self-pay | Admitting: Nurse Practitioner

## 2024-11-29 ENCOUNTER — Ambulatory Visit

## 2024-11-29 ENCOUNTER — Ambulatory Visit: Payer: Medicare HMO

## 2024-11-29 VITALS — Ht 61.0 in | Wt 123.0 lb

## 2024-11-29 DIAGNOSIS — Z0000 Encounter for general adult medical examination without abnormal findings: Secondary | ICD-10-CM

## 2024-11-29 NOTE — Progress Notes (Signed)
 "  Chief Complaint  Patient presents with   Medicare Wellness     Subjective:   Susan Liu is a 69 y.o. female who presents for a Medicare Annual Wellness Visit.  Visit info / Clinical Intake: Medicare Wellness Visit Type:: Subsequent Annual Wellness Visit Persons participating in visit and providing information:: patient Medicare Wellness Visit Mode:: Telephone If telephone:: video declined Since this visit was completed virtually, some vitals may be partially provided or unavailable. Missing vitals are due to the limitations of the virtual format.: Documented vitals are patient reported If Telephone or Video please confirm:: I connected with patient using audio/video enable telemedicine. I verified patient identity with two identifiers, discussed telehealth limitations, and patient agreed to proceed. Patient Location:: home Provider Location:: office Interpreter Needed?: No Pre-visit prep was completed: yes AWV questionnaire completed by patient prior to visit?: yes Date:: 11/22/24 Living arrangements:: (Patient-Rptd) lives with spouse/significant other Patient's Overall Health Status Rating: (Patient-Rptd) very good Typical amount of pain: (Patient-Rptd) none Does pain affect daily life?: (Patient-Rptd) no Are you currently prescribed opioids?: no  Dietary Habits and Nutritional Risks How many meals a day?: (Patient-Rptd) 3 Eats fruit and vegetables daily?: (Patient-Rptd) yes Most meals are obtained by: (Patient-Rptd) preparing own meals In the last 2 weeks, have you had any of the following?: none Diabetic:: no  Functional Status Activities of Daily Living (to include ambulation/medication): (Patient-Rptd) Independent Ambulation: (Patient-Rptd) Independent Medication Administration: (Patient-Rptd) Independent Home Management (perform basic housework or laundry): (Patient-Rptd) Independent Manage your own finances?: (Patient-Rptd) yes Primary transportation is:  (Patient-Rptd) driving Concerns about vision?: no *vision screening is required for WTM* Concerns about hearing?: no  Fall Screening Falls in the past year?: (Patient-Rptd) 0 Number of falls in past year: 0 Was there an injury with Fall?: 0 Fall Risk Category Calculator: 0 Patient Fall Risk Level: Low Fall Risk  Fall Risk Patient at Risk for Falls Due to: No Fall Risks Fall risk Follow up: Falls evaluation completed; Education provided; Falls prevention discussed  Home and Transportation Safety: All rugs have non-skid backing?: (Patient-Rptd) yes All stairs or steps have railings?: (Patient-Rptd) yes Grab bars in the bathtub or shower?: (Patient-Rptd) yes Have non-skid surface in bathtub or shower?: (Patient-Rptd) yes Good home lighting?: (Patient-Rptd) yes Regular seat belt use?: (Patient-Rptd) yes Hospital stays in the last year:: (Patient-Rptd) no  Cognitive Assessment Difficulty concentrating, remembering, or making decisions? : (Patient-Rptd) no Will 6CIT or Mini Cog be Completed: no 6CIT or Mini Cog Declined: patient alert, oriented, able to answer questions appropriately and recall recent events  Advance Directives (For Healthcare) Does Patient Have a Medical Advance Directive?: No Would patient like information on creating a medical advance directive?: Yes (MAU/Ambulatory/Procedural Areas - Information given)  Reviewed/Updated  Reviewed/Updated: Reviewed All (Medical, Surgical, Family, Medications, Allergies, Care Teams, Patient Goals)    Allergies (verified) Erythromycin   Current Medications (verified) Outpatient Encounter Medications as of 11/29/2024  Medication Sig   alendronate  (FOSAMAX ) 70 MG/75ML solution Take 70 mg by mouth every 7 (seven) days.   Ascorbic Acid (VITAMIN C) POWD Take 0.5 Scoops by mouth 4 (four) times a week.   Calcium  Carb-Cholecalciferol (CALTRATE 600+D3 PO) Take 1 tablet by mouth daily.   Creatine Monohydrate POWD Take 0.5 Scoops by  mouth 4 (four) times a week.   Glucosamine-Chondroit-Vit C-Mn (GLUCOSAMINE 1500 COMPLEX PO) Take 1,500 mg by mouth daily.   hydrocortisone  cream 1 % Apply 1 Application topically 2 (two) times daily.   Lysine HCl POWD Take 1,000 mg by  mouth 4 (four) times a week.   Menthol (ICY HOT) 5 % PTCH Apply 1 patch topically daily as needed (pain).   Methylsulfonylmethane (MSM PO) Take 1 Dose by mouth 4 (four) times a week. 1 dose = 1 teaspoonful   moxifloxacin  (VIGAMOX ) 0.5 % ophthalmic solution INSTILL ONE DROP IN LEFT EYE THREE TIMESA DAY FOR 5 DAYS   Multiple Vitamins-Minerals (ONE-A-DAY WOMENS PO) Take 1 tablet by mouth daily.   Omega-3 Fatty Acids (FISH OIL) 1200 MG CAPS Take 1,200 mg by mouth daily.   RESTASIS  0.05 % ophthalmic emulsion INSTILL ONE DROP IN BOTH EYES TWO TIMES DAILY   rosuvastatin  (CRESTOR ) 20 MG tablet TAKE ONE TABLET (20MG  TOTAL) BY MOUTH DAILY STOP PRAVASTATIN    SUMAtriptan  (IMITREX ) 100 MG tablet Take 1 tablet (100 mg total) by mouth daily as needed. May repeat in 2 hours if headache persists or recurs.   triamcinolone  (NASACORT ) 55 MCG/ACT AERO nasal inhaler Place 2 sprays into the nose daily.   vitamin C (ASCORBIC ACID) 500 MG tablet Take 1,000 mg by mouth 3 (three) times a week.   No facility-administered encounter medications on file as of 11/29/2024.    History: Past Medical History:  Diagnosis Date   Arthritis    Heart murmur 1993   Hyperlipidemia    Migraine    Osteoporosis    Past Surgical History:  Procedure Laterality Date   CESAREAN SECTION     x2   COLONOSCOPY  2008   Dr.Rourk-normal exam   COLONOSCOPY WITH PROPOFOL  N/A 11/10/2021   Procedure: COLONOSCOPY WITH PROPOFOL ;  Surgeon: Eartha Angelia Sieving, MD;  Location: [facility];  Service: Gastroenterology;  Laterality: N/A;  8:30 / Rm 3  phone call   POLYPECTOMY  11/10/2021   Procedure: POLYPECTOMY;  Surgeon: Eartha Angelia Sieving, MD;  Location: [facility];  Service: Gastroenterology;;    Family History  Problem Relation Age of Onset   Cancer Mother        Lung   Colon polyps Mother    Heart attack Father    Alcohol abuse Father    Stroke Father    Heart disease Father    Memory loss Brother    Memory loss Paternal Aunt    Memory loss Paternal Uncle    Colon cancer Neg Hx    Esophageal cancer Neg Hx    Rectal cancer Neg Hx    Stomach cancer Neg Hx    Breast cancer Neg Hx    Social History   Occupational History   Not on file  Tobacco Use   Smoking status: Never   Smokeless tobacco: Never  Vaping Use   Vaping status: Never Used  Substance and Sexual Activity   Alcohol use: Never   Drug use: Not Currently   Sexual activity: Not Currently    Birth control/protection: Other-see comments, None    Comment: Painful in last few years   Tobacco Counseling Counseling given: Not Answered  SDOH Screenings   Food Insecurity: No Food Insecurity (11/22/2024)  Housing: Low Risk (11/22/2024)  Transportation Needs: No Transportation Needs (11/22/2024)  Utilities: Not At Risk (11/29/2024)  Depression (PHQ2-9): Low Risk (11/29/2024)  Financial Resource Strain: Low Risk (11/22/2024)  Physical Activity: Sufficiently Active (11/22/2024)  Social Connections: Socially Integrated (11/22/2024)  Stress: No Stress Concern Present (11/22/2024)  Tobacco Use: Low Risk (11/29/2024)  Health Literacy: Adequate Health Literacy (11/29/2024)   See flowsheets for full screening details  Depression Screen PHQ 2 & 9 Depression Scale- Over the past  2 weeks, how often have you been bothered by any of the following problems? Little interest or pleasure in doing things: 0 Feeling down, depressed, or hopeless (PHQ Adolescent also includes...irritable): 0 PHQ-2 Total Score: 0 Trouble falling or staying asleep, or sleeping too much: 0 Feeling tired or having little energy: 1 Poor appetite or overeating (PHQ Adolescent also includes...weight loss): 0 Feeling bad about yourself - or that you  are a failure or have let yourself or your family down: 0 Trouble concentrating on things, such as reading the newspaper or watching television (PHQ Adolescent also includes...like school work): 0 Moving or speaking so slowly that other people could have noticed. Or the opposite - being so fidgety or restless that you have been moving around a lot more than usual: 0 Thoughts that you would be better off dead, or of hurting yourself in some way: 0 PHQ-9 Total Score: 1 If you checked off any problems, how difficult have these problems made it for you to do your work, take care of things at home, or get along with other people?: Not difficult at all     Goals Addressed             This Visit's Progress    Remain active and independent   On track            Objective:    Today's Vitals   11/29/24 0921  Weight: 123 lb (55.8 kg)  Height: 5' 1 (1.549 m)   Body mass index is 23.24 kg/m.  Hearing/Vision screen Hearing Screening - Comments:: Patient is able to hear conversational tones without difficulty. No issues reported.   Vision Screening - Comments:: Wears rx glasses - up to date with routine eye exams  Immunizations and Health Maintenance Health Maintenance  Topic Date Due   COVID-19 Vaccine (3 - Moderna risk series) 05/05/2020   DTaP/Tdap/Td (2 - Tdap) 06/14/2023   Influenza Vaccine  02/25/2025 (Originally 06/28/2024)   Medicare Annual Wellness (AWV)  11/29/2025   Mammogram  10/02/2026   Bone Density Scan  10/10/2026   Colonoscopy  11/10/2026   Pneumococcal Vaccine: 50+ Years  Completed   Hepatitis C Screening  Completed   Zoster Vaccines- Shingrix  Completed   Meningococcal B Vaccine  Aged Out   COLON CANCER SCREENING ANNUAL FOBT  Discontinued        Assessment/Plan:  This is a routine wellness examination for Cherrise.  Patient Care Team: Alphonsa Glendia LABOR, MD as PCP - General (Family Medicine)  I have personally reviewed and noted the following in the  patients chart:   Medical and social history Use of alcohol, tobacco or illicit drugs  Current medications and supplements including opioid prescriptions. Functional ability and status Nutritional status Physical activity Advanced directives List of other physicians Hospitalizations, surgeries, and ER visits in previous 12 months Vitals Screenings to include cognitive, depression, and falls Referrals and appointments  No orders of the defined types were placed in this encounter.  In addition, I have reviewed and discussed with patient certain preventive protocols, quality metrics, and best practice recommendations. A written personalized care plan for preventive services as well as general preventive health recommendations were provided to patient.   Lavelle Charmaine Browner, LPN   07/04/7972   Return in 1 year (on 11/29/2025).  After Visit Summary: (MyChart) Due to this being a telephonic visit, the after visit summary with patients personalized plan was offered to patient via MyChart   Nurse Notes: No voiced or noted  concerns at this time  "

## 2024-11-29 NOTE — Patient Instructions (Signed)
 [name],  Thank you for taking the time for your Medicare Wellness Visit. I appreciate your continued commitment to your health goals. Please review the care plan we discussed, and feel free to reach out if I can assist you further.  Please note that Annual Wellness Visits do not include a physical exam. Some assessments may be limited, especially if the visit was conducted virtually. If needed, we may recommend an in-person follow-up with your provider.  Ongoing Care Seeing your primary care provider every 3 to 6 months helps us  monitor your health and provide consistent, personalized care.   Referrals If a referral was made during today's visit and you haven't received any updates within two weeks, please contact the referred provider directly to check on the status.  Recommended Screenings:  Health Maintenance  Topic Date Due   COVID-19 Vaccine (3 - Moderna risk series) 05/05/2020   DTaP/Tdap/Td vaccine (2 - Tdap) 06/14/2023   Flu Shot  02/25/2025*   Medicare Annual Wellness Visit  11/29/2025   Breast Cancer Screening  10/02/2026   Osteoporosis screening with Bone Density Scan  10/10/2026   Colon Cancer Screening  11/10/2026   Pneumococcal Vaccine for age over 48  Completed   Hepatitis C Screening  Completed   Zoster (Shingles) Vaccine  Completed   Meningitis B Vaccine  Aged Out   Stool Blood Test  Discontinued  *Topic was postponed. The date shown is not the original due date.       11/22/2024   10:22 AM  Advanced Directives  Does Patient Have a Medical Advance Directive? No  Would patient like information on creating a medical advance directive? Yes (MAU/Ambulatory/Procedural Areas - Information given)   Information on Advanced Care Planning can be found at [place]  Secretary of [facility] Advance Health Care Directives Advance Health Care Directives ([email or web address]   Vision: Annual vision screenings are recommended for early detection of glaucoma, cataracts, and diabetic  retinopathy. These exams can also reveal signs of chronic conditions such as diabetes and high blood pressure.  Dental: Annual dental screenings help detect early signs of oral cancer, gum disease, and other conditions linked to overall health, including heart disease and diabetes.  Please see the attached documents for additional preventive care recommendations.

## 2024-12-02 ENCOUNTER — Encounter: Payer: Self-pay | Admitting: Nurse Practitioner

## 2024-12-02 ENCOUNTER — Ambulatory Visit: Admitting: Nurse Practitioner

## 2024-12-02 VITALS — BP 126/83 | HR 67 | Temp 98.6°F | Ht 61.0 in | Wt 121.5 lb

## 2024-12-02 DIAGNOSIS — Z01411 Encounter for gynecological examination (general) (routine) with abnormal findings: Secondary | ICD-10-CM

## 2024-12-02 DIAGNOSIS — Z01419 Encounter for gynecological examination (general) (routine) without abnormal findings: Secondary | ICD-10-CM

## 2024-12-02 NOTE — Progress Notes (Signed)
 "  Subjective:    Patient ID: Susan Liu, female    DOB: 1956/05/10, 69 y.o.   MRN: [id number]  HPI Discussed the use of AI scribe software for clinical note transcription with the patient, who gave verbal consent to proceed.  History of Present Illness Susan Liu is a 69 year old female who presents for a routine physical.  She experiences difficulty when getting out of her SUV, specifically needing to lift her left leg to get up. The sensation is not painful but requires assistance to lift the leg. This issue is isolated to the left leg and does not affect her ability to perform other activities such as squatting or bending. No hip or knee pain, and no weakness in the right leg. Able to perform all of her normal activities. Swimming 3 x per week. Walking 30-45 minutes. Stretching and weights twice daily.   She expresses concern about her own memory, noting occasional forgetfulness but generally being able to recall information after some time. Family history is significant for Alzheimer's disease on her father's side, affecting her father and all four of his siblings, with her brother currently undergoing assessment for the condition.  Her diet is generally healthy, with occasional indulgences. She follows a modified DASH diet and incorporates Mediterranean diet principles, focusing on whole foods and limiting processed foods. She uses a grocery app called [employer] to help identify healthy food options. Her weight is stable, and she maintains an active lifestyle despite some limitations due to back issues.  She is currently on Crestor  20 mg for cholesterol management and Fosamax  for bone health. Her last bone density test was in the fall of 2025, and she is due for another in November 2027. Recent lab work shows stable kidney function, normal glucose levels, and a slight increase in LDL cholesterol, but her HDL remains high. She has a history of using iron skillets to maintain iron levels and  reports no significant changes in her health status.  Married, same female sexual partner.  Regular dental and vision exams.    Review of Systems  Constitutional:  Negative for activity change, appetite change and fatigue.  HENT:  Negative for sore throat and trouble swallowing.   Respiratory:  Negative for cough, chest tightness, shortness of breath and wheezing.   Cardiovascular:  Negative for chest pain.  Gastrointestinal:  Negative for abdominal distention, abdominal pain, constipation, diarrhea, nausea and vomiting.  Genitourinary:  Negative for difficulty urinating, dysuria, enuresis, frequency, genital sores, pelvic pain, urgency, vaginal bleeding and vaginal discharge.      11/29/2024    9:42 AM  Depression screen PHQ 2/9  Decreased Interest 0  Down, Depressed, Hopeless 0  PHQ - 2 Score 0   Social History[1]        Objective:   Physical Exam Vitals and nursing note reviewed.  Constitutional:      General: She is not in acute distress.    Appearance: She is well-developed.  Neck:     Thyroid: No thyromegaly.     Trachea: No tracheal deviation.     Comments: Thyroid non tender to palpation. No mass or goiter noted.  Cardiovascular:     Rate and Rhythm: Normal rate and regular rhythm.     Heart sounds: Normal heart sounds. No murmur heard. Pulmonary:     Effort: Pulmonary effort is normal.     Breath sounds: Normal breath sounds.  Chest:  Breasts:    Right: No swelling, inverted nipple, mass,  skin change or tenderness.     Left: No swelling, inverted nipple, mass, skin change or tenderness.  Abdominal:     General: There is no distension.     Palpations: Abdomen is soft.     Tenderness: There is no abdominal tenderness.  Genitourinary:    Comments: Defers GU exam. Denies any problems.  Musculoskeletal:     Cervical back: Normal range of motion and neck supple.  Lymphadenopathy:     Cervical: No cervical adenopathy.     Upper Body:     Right upper body: No  supraclavicular, axillary or pectoral adenopathy.     Left upper body: No supraclavicular, axillary or pectoral adenopathy.  Skin:    General: Skin is warm and dry.     Findings: No rash.  Neurological:     Mental Status: She is alert and oriented to person, place, and time.  Psychiatric:        Mood and Affect: Mood normal.        Behavior: Behavior normal.        Thought Content: Thought content normal.        Judgment: Judgment normal.    Today's Vitals   12/02/24 1311  BP: 126/83  Pulse: 67  Temp: 98.6 F (37 C)  SpO2: 98%  Weight: 121 lb 8 oz (55.1 kg)  Height: 5' 1 (1.549 m)   Body mass index is 22.96 kg/m.   Results for orders placed or performed in visit on 10/30/24  CBC with Differential/Platelet   Collection Time: 11/18/24  8:09 AM  Result Value Ref Range   WBC 6.6 3.4 - 10.8 x10E3/uL   RBC 4.56 3.77 - 5.28 x10E6/uL   Hemoglobin 13.3 11.1 - 15.9 g/dL   Hematocrit 57.1 65.9 - 46.6 %   MCV 94 79 - 97 fL   MCH 29.2 26.6 - 33.0 pg   MCHC 31.1 (L) 31.5 - 35.7 g/dL   RDW 87.3 88.2 - 84.5 %   Platelets 433 150 - 450 x10E3/uL   Neutrophils 47 Not Estab. %   Lymphs 41 Not Estab. %   Monocytes 9 Not Estab. %   Eos 2 Not Estab. %   Basos 1 Not Estab. %   Neutrophils Absolute 3.1 1.4 - 7.0 x10E3/uL   Lymphocytes Absolute 2.7 0.7 - 3.1 x10E3/uL   Monocytes Absolute 0.6 0.1 - 0.9 x10E3/uL   EOS (ABSOLUTE) 0.2 0.0 - 0.4 x10E3/uL   Basophils Absolute 0.1 0.0 - 0.2 x10E3/uL   Immature Granulocytes 0 Not Estab. %   Immature Grans (Abs) 0.0 0.0 - 0.1 x10E3/uL  Comprehensive metabolic panel with GFR   Collection Time: 11/18/24  8:09 AM  Result Value Ref Range   Glucose 87 70 - 99 mg/dL   BUN 16 8 - 27 mg/dL   Creatinine, Ser 9.18 0.57 - 1.00 mg/dL   eGFR 79 >40 fO/fpw/8.26   BUN/Creatinine Ratio 20 12 - 28   Sodium 140 134 - 144 mmol/L   Potassium 4.4 3.5 - 5.2 mmol/L   Chloride 100 96 - 106 mmol/L   CO2 25 20 - 29 mmol/L   Calcium  9.4 8.7 - 10.3 mg/dL   Total  Protein 6.6 6.0 - 8.5 g/dL   Albumin 4.4 3.9 - 4.9 g/dL   Globulin, Total 2.2 1.5 - 4.5 g/dL   Bilirubin Total 0.6 0.0 - 1.2 mg/dL   Alkaline Phosphatase 60 49 - 135 IU/L   AST 26 0 - 40 IU/L   ALT 20  0 - 32 IU/L  Lipid panel   Collection Time: 11/18/24  8:09 AM  Result Value Ref Range   Cholesterol, Total 200 (H) 100 - 199 mg/dL   Triglycerides 72 0 - 149 mg/dL   HDL 87 >60 mg/dL   VLDL Cholesterol Cal 13 5 - 40 mg/dL   LDL Chol Calc (NIH) 899 (H) 0 - 99 mg/dL   Chol/HDL Ratio 2.3 0.0 - 4.4 ratio    Mini-Cog - 12/02/24 1350     Normal clock drawing test? yes    How many words correct? 3               Assessment & Plan:  1. Well woman exam (Primary) Continue healthy lifestyle habits.  LDL slightly elevated at 100 mg/dL, HDL high, favorable cholesterol ratio. Statin therapy effective. - Continue rosuvastatin  (Crestor ) 20 mg daily. - Maintain current diet with emphasis on healthy fats and whole foods. - Continue alendronate  (Fosamax ) 70 mg weekly. - Schedule bone density scan for November 2027. - No evidence at this time of cognitive issues.   Return in about 1 year (around 12/02/2025) for physical.        [1]  Social History Tobacco Use   Smoking status: Never   Smokeless tobacco: Never  Vaping Use   Vaping status: Never Used  Substance Use Topics   Alcohol use: Never   Drug use: Not Currently   "

## 2024-12-03 ENCOUNTER — Encounter: Payer: Self-pay | Admitting: Nurse Practitioner

## 2024-12-26 ENCOUNTER — Ambulatory Visit: Admitting: Nurse Practitioner

## 2024-12-26 VITALS — BP 139/73 | HR 88 | Temp 97.3°F | Ht 61.0 in | Wt 123.4 lb

## 2024-12-26 DIAGNOSIS — Z818 Family history of other mental and behavioral disorders: Secondary | ICD-10-CM

## 2024-12-26 DIAGNOSIS — J300 Vasomotor rhinitis: Secondary | ICD-10-CM

## 2024-12-26 MED ORDER — AMOXICILLIN 500 MG PO TABS: 500.0000 mg | ORAL_TABLET | Freq: Three times a day (TID) | ORAL | 0 refills | Status: AC

## 2024-12-26 MED ORDER — PREDNISONE 20 MG PO TABS: ORAL_TABLET | ORAL | 0 refills | Status: AC

## 2024-12-26 NOTE — Progress Notes (Signed)
" ° °  Subjective:    Patient ID: Susan Liu, female    DOB: August 13, 1956, 69 y.o.   MRN: [id number]  HPI    Review of Systems     Objective:   Physical Exam        Assessment & Plan:    "

## 2024-12-27 ENCOUNTER — Encounter: Payer: Self-pay | Admitting: Nurse Practitioner

## 2024-12-28 ENCOUNTER — Encounter: Payer: Self-pay | Admitting: Nurse Practitioner

## 2025-01-01 NOTE — Telephone Encounter (Signed)
0
